# Patient Record
Sex: Female | Born: 1960 | Race: Black or African American | Hispanic: No | Marital: Single | Smoking: Current every day smoker
Health system: Southern US, Community
[De-identification: ages and names within clinical notes are randomized; demographics above are authoritative.]

## PROBLEM LIST (undated history)

## (undated) DIAGNOSIS — E785 Hyperlipidemia, unspecified: Secondary | ICD-10-CM

## (undated) DIAGNOSIS — G629 Polyneuropathy, unspecified: Secondary | ICD-10-CM

## (undated) DIAGNOSIS — E119 Type 2 diabetes mellitus without complications: Secondary | ICD-10-CM

## (undated) DIAGNOSIS — I10 Essential (primary) hypertension: Secondary | ICD-10-CM

## (undated) DIAGNOSIS — I639 Cerebral infarction, unspecified: Secondary | ICD-10-CM

---

## 2020-11-05 ENCOUNTER — Other Ambulatory Visit: Payer: Self-pay

## 2020-11-05 ENCOUNTER — Emergency Department: Payer: Medicare Other

## 2020-11-05 ENCOUNTER — Inpatient Hospital Stay
Admission: EM | Admit: 2020-11-05 | Discharge: 2020-11-15 | DRG: 871 | Disposition: A | Discharge status: Home Health | Payer: Medicare Other | Attending: Internal Medicine | Admitting: Internal Medicine

## 2020-11-05 ENCOUNTER — Encounter: Payer: Self-pay | Admitting: Emergency Medicine

## 2020-11-05 DIAGNOSIS — E876 Hypokalemia: Secondary | ICD-10-CM | POA: Diagnosis not present

## 2020-11-05 DIAGNOSIS — J189 Pneumonia, unspecified organism: Secondary | ICD-10-CM | POA: Diagnosis present

## 2020-11-05 DIAGNOSIS — E1122 Type 2 diabetes mellitus with diabetic chronic kidney disease: Secondary | ICD-10-CM | POA: Diagnosis present

## 2020-11-05 DIAGNOSIS — N139 Obstructive and reflux uropathy, unspecified: Secondary | ICD-10-CM | POA: Diagnosis present

## 2020-11-05 DIAGNOSIS — G4733 Obstructive sleep apnea (adult) (pediatric): Secondary | ICD-10-CM | POA: Diagnosis present

## 2020-11-05 DIAGNOSIS — F419 Anxiety disorder, unspecified: Secondary | ICD-10-CM | POA: Diagnosis present

## 2020-11-05 DIAGNOSIS — N189 Chronic kidney disease, unspecified: Secondary | ICD-10-CM | POA: Diagnosis present

## 2020-11-05 DIAGNOSIS — I129 Hypertensive chronic kidney disease with stage 1 through stage 4 chronic kidney disease, or unspecified chronic kidney disease: Secondary | ICD-10-CM | POA: Diagnosis present

## 2020-11-05 DIAGNOSIS — R338 Other retention of urine: Secondary | ICD-10-CM | POA: Diagnosis not present

## 2020-11-05 DIAGNOSIS — Z7902 Long term (current) use of antithrombotics/antiplatelets: Secondary | ICD-10-CM

## 2020-11-05 DIAGNOSIS — E785 Hyperlipidemia, unspecified: Secondary | ICD-10-CM | POA: Diagnosis present

## 2020-11-05 DIAGNOSIS — Z8673 Personal history of transient ischemic attack (TIA), and cerebral infarction without residual deficits: Secondary | ICD-10-CM

## 2020-11-05 DIAGNOSIS — E1142 Type 2 diabetes mellitus with diabetic polyneuropathy: Secondary | ICD-10-CM | POA: Diagnosis not present

## 2020-11-05 DIAGNOSIS — L0231 Cutaneous abscess of buttock: Secondary | ICD-10-CM | POA: Diagnosis not present

## 2020-11-05 DIAGNOSIS — M109 Gout, unspecified: Secondary | ICD-10-CM | POA: Diagnosis present

## 2020-11-05 DIAGNOSIS — Z6841 Body Mass Index (BMI) 40.0 and over, adult: Secondary | ICD-10-CM | POA: Diagnosis not present

## 2020-11-05 DIAGNOSIS — I10 Essential (primary) hypertension: Secondary | ICD-10-CM | POA: Diagnosis present

## 2020-11-05 DIAGNOSIS — E039 Hypothyroidism, unspecified: Secondary | ICD-10-CM | POA: Diagnosis present

## 2020-11-05 DIAGNOSIS — B379 Candidiasis, unspecified: Secondary | ICD-10-CM | POA: Diagnosis not present

## 2020-11-05 DIAGNOSIS — R6520 Severe sepsis without septic shock: Secondary | ICD-10-CM | POA: Diagnosis present

## 2020-11-05 DIAGNOSIS — N179 Acute kidney failure, unspecified: Secondary | ICD-10-CM | POA: Diagnosis present

## 2020-11-05 DIAGNOSIS — E11649 Type 2 diabetes mellitus with hypoglycemia without coma: Secondary | ICD-10-CM | POA: Diagnosis not present

## 2020-11-05 DIAGNOSIS — F32A Depression, unspecified: Secondary | ICD-10-CM | POA: Diagnosis present

## 2020-11-05 DIAGNOSIS — F17210 Nicotine dependence, cigarettes, uncomplicated: Secondary | ICD-10-CM | POA: Diagnosis present

## 2020-11-05 DIAGNOSIS — E1140 Type 2 diabetes mellitus with diabetic neuropathy, unspecified: Secondary | ICD-10-CM | POA: Diagnosis present

## 2020-11-05 DIAGNOSIS — A4181 Sepsis due to Enterococcus: Secondary | ICD-10-CM | POA: Diagnosis present

## 2020-11-05 DIAGNOSIS — R339 Retention of urine, unspecified: Secondary | ICD-10-CM | POA: Diagnosis not present

## 2020-11-05 DIAGNOSIS — G9341 Metabolic encephalopathy: Secondary | ICD-10-CM | POA: Diagnosis present

## 2020-11-05 DIAGNOSIS — R404 Transient alteration of awareness: Secondary | ICD-10-CM | POA: Diagnosis not present

## 2020-11-05 DIAGNOSIS — E119 Type 2 diabetes mellitus without complications: Secondary | ICD-10-CM

## 2020-11-05 DIAGNOSIS — L03317 Cellulitis of buttock: Secondary | ICD-10-CM | POA: Diagnosis not present

## 2020-11-05 DIAGNOSIS — Z79890 Hormone replacement therapy (postmenopausal): Secondary | ICD-10-CM

## 2020-11-05 DIAGNOSIS — K611 Rectal abscess: Secondary | ICD-10-CM | POA: Diagnosis not present

## 2020-11-05 DIAGNOSIS — K7460 Unspecified cirrhosis of liver: Secondary | ICD-10-CM | POA: Diagnosis present

## 2020-11-05 DIAGNOSIS — R7401 Elevation of levels of liver transaminase levels: Secondary | ICD-10-CM

## 2020-11-05 DIAGNOSIS — Z7982 Long term (current) use of aspirin: Secondary | ICD-10-CM

## 2020-11-05 DIAGNOSIS — R531 Weakness: Secondary | ICD-10-CM | POA: Diagnosis present

## 2020-11-05 DIAGNOSIS — Z20822 Contact with and (suspected) exposure to covid-19: Secondary | ICD-10-CM | POA: Diagnosis present

## 2020-11-05 HISTORY — DX: Essential (primary) hypertension: I10

## 2020-11-05 HISTORY — DX: Hyperlipidemia, unspecified: E78.5

## 2020-11-05 HISTORY — DX: Cerebral infarction, unspecified: I63.9

## 2020-11-05 HISTORY — DX: Polyneuropathy, unspecified: G62.9

## 2020-11-05 HISTORY — DX: Type 2 diabetes mellitus without complications: E11.9

## 2020-11-05 LAB — BASIC METABOLIC PANEL
Anion gap: 12 (ref 5–15)
BUN: 44 mg/dL — ABNORMAL HIGH (ref 6–20)
CO2: 20 mmol/L — ABNORMAL LOW (ref 22–32)
Calcium: 9 mg/dL (ref 8.9–10.3)
Chloride: 102 mmol/L (ref 98–111)
Creatinine, Ser: 2.96 mg/dL — ABNORMAL HIGH (ref 0.44–1.00)
GFR, Estimated: 18 mL/min — ABNORMAL LOW (ref >60)
Glucose, Bld: 151 mg/dL — ABNORMAL HIGH (ref 70–99)
Potassium: 3.8 mmol/L (ref 3.5–5.1)
Sodium: 134 mmol/L — ABNORMAL LOW (ref 135–145)

## 2020-11-05 LAB — URINALYSIS, COMPLETE (UACMP) WITH MICROSCOPIC
Bilirubin Urine: NEGATIVE
Glucose, UA: >=500 mg/dL — AB
Ketones, ur: NEGATIVE mg/dL
Leukocytes,Ua: NEGATIVE
Nitrite: NEGATIVE
Protein, ur: >=300 mg/dL — AB
Specific Gravity, Urine: 1.017 (ref 1.005–1.030)
pH: 5 (ref 5.0–8.0)

## 2020-11-05 LAB — CBC
HCT: 35.4 % — ABNORMAL LOW (ref 36.0–46.0)
Hemoglobin: 11.5 g/dL — ABNORMAL LOW (ref 12.0–15.0)
MCH: 26.6 pg (ref 26.0–34.0)
MCHC: 32.5 g/dL (ref 30.0–36.0)
MCV: 81.8 fL (ref 80.0–100.0)
Platelets: 196 10*3/uL (ref 150–400)
RBC: 4.33 MIL/uL (ref 3.87–5.11)
RDW: 18.3 % — ABNORMAL HIGH (ref 11.5–15.5)
WBC: 17.8 10*3/uL — ABNORMAL HIGH (ref 4.0–10.5)
nRBC: 0 % (ref 0.0–0.2)

## 2020-11-05 LAB — RESP PANEL BY RT-PCR (FLU A&B, COVID) ARPGX2
Influenza A by PCR: NEGATIVE
Influenza B by PCR: NEGATIVE
SARS Coronavirus 2 by RT PCR: NEGATIVE

## 2020-11-05 LAB — CBG MONITORING, ED: Glucose-Capillary: 122 mg/dL — ABNORMAL HIGH (ref 70–99)

## 2020-11-05 MED ORDER — SODIUM CHLORIDE 0.9 % IV BOLUS
1000.0000 mL | Freq: Once | INTRAVENOUS | Status: AC
  Administered 2020-11-05: 1000 mL via INTRAVENOUS

## 2020-11-05 MED ORDER — INSULIN ASPART 100 UNIT/ML ~~LOC~~ SOLN
0.0000 [IU] | Freq: Every day | SUBCUTANEOUS | Status: DC
  Administered 2020-11-12: 22:00:00 2 [IU] via SUBCUTANEOUS
  Administered 2020-11-13: 22:00:00 5 [IU] via SUBCUTANEOUS
  Administered 2020-11-14: 22:00:00 3 [IU] via SUBCUTANEOUS
  Filled 2020-11-05 (×3): qty 1

## 2020-11-05 MED ORDER — INSULIN ASPART 100 UNIT/ML ~~LOC~~ SOLN
0.0000 [IU] | Freq: Three times a day (TID) | SUBCUTANEOUS | Status: DC
  Administered 2020-11-06 (×2): 11 [IU] via SUBCUTANEOUS
  Administered 2020-11-06 – 2020-11-08 (×2): 8 [IU] via SUBCUTANEOUS
  Administered 2020-11-10 – 2020-11-12 (×4): 3 [IU] via SUBCUTANEOUS
  Administered 2020-11-13: 2 [IU] via SUBCUTANEOUS
  Administered 2020-11-14: 8 [IU] via SUBCUTANEOUS
  Administered 2020-11-14: 11 [IU] via SUBCUTANEOUS
  Administered 2020-11-14: 16:00:00 5 [IU] via SUBCUTANEOUS
  Administered 2020-11-15 (×2): 8 [IU] via SUBCUTANEOUS
  Filled 2020-11-05 (×14): qty 1

## 2020-11-05 MED ORDER — AZITHROMYCIN 250 MG PO TABS: ORAL_TABLET | ORAL | 0 refills | Status: DC

## 2020-11-05 MED ORDER — SODIUM CHLORIDE 0.9 % IV SOLN: INTRAVENOUS | Status: DC

## 2020-11-05 MED ORDER — SODIUM CHLORIDE 0.9 % IV SOLN
500.0000 mg | INTRAVENOUS | Status: DC
  Administered 2020-11-05 – 2020-11-06 (×2): 500 mg via INTRAVENOUS
  Filled 2020-11-05 (×3): qty 500

## 2020-11-05 MED ORDER — SODIUM CHLORIDE 0.9 % IV SOLN
1.0000 g | Freq: Once | INTRAVENOUS | Status: AC
  Administered 2020-11-05: 1 g via INTRAVENOUS
  Filled 2020-11-05: qty 10

## 2020-11-05 MED ORDER — SODIUM CHLORIDE 0.9 % IV SOLN
2.0000 g | INTRAVENOUS | Status: DC
  Administered 2020-11-06 – 2020-11-09 (×4): 2 g via INTRAVENOUS
  Filled 2020-11-05 (×2): qty 20
  Filled 2020-11-05 (×3): qty 2

## 2020-11-05 MED ORDER — ENOXAPARIN SODIUM 40 MG/0.4ML ~~LOC~~ SOLN
40.0000 mg | SUBCUTANEOUS | Status: DC
  Administered 2020-11-06 – 2020-11-09 (×4): 40 mg via SUBCUTANEOUS
  Filled 2020-11-05 (×4): qty 0.4

## 2020-11-05 MED ORDER — ACETAMINOPHEN 325 MG PO TABS
650.0000 mg | ORAL_TABLET | Freq: Once | ORAL | Status: AC
  Administered 2020-11-05: 650 mg via ORAL
  Filled 2020-11-05: qty 2

## 2020-11-05 NOTE — ED Provider Notes (Signed)
[facility] Emergency Department Provider Note  ____________________________________________  Time seen: Approximately 4:14 PM  I have reviewed the triage vital signs and the nursing notes.   HISTORY  Chief Complaint Weakness    HPI Tina Gregory is a 59 y.o. female who presents the emergency department complaining of weakness, fall today.  Patient reports that she was going to [employer] with her sister when her sister letter out of the car.  Patient states that she was supposed to write a wheelchair but she became weak and fell hitting her head and landing on her left knee.  Patient initially complained of headache and knee pain.  With the interview in the room, patient endorsed having some weakness, some lower pelvic/abdominal pain earlier today.  Patient denied any pain other than a headache and left knee pain currently.  No visual changes are reported.  No neck pain.  Patient denies any shortness of breath but has had a light cough.  Patient denies any difficulty breathing.  No nausea, vomiting, diarrhea or constipation.  She does have history of diabetes, hypertension, neuropathy and CVA.  She also states that she has an undetermined heart condition but is unsure exactly what that entails.  She denied any other complaints or medical problems at this time.  No medicines after her fall today.  Patient states that "I could have had a fever, but I do not know.         Past Medical History:  Diagnosis Date  . Diabetes mellitus without complication (HCC)   . Hyperlipidemia   . Hypertension   . Neuropathy   . Stroke [facility])     Patient Active Problem List   Diagnosis Date Noted  . AKI (acute kidney injury) (HCC) 11/05/2020  . Diabetes (HCC) 11/05/2020  . Essential hypertension 11/05/2020  . Hyperlipemia 11/05/2020  . CAP (community acquired pneumonia) 11/05/2020      Prior to Admission medications   Medication Sig Start Date End Date Taking? Authorizing  Provider  azithromycin (ZITHROMAX Z-PAK) 250 MG tablet Take 2 tablets (500 mg) on  Day 1,  followed by 1 tablet (250 mg) once daily on Days 2 through 5. 11/05/20   Indra Wolters, Delorise Royals, PA-C    Allergies Lisinopril and Shellfish allergy  No family history on file.  Social History Social History   Tobacco Use  . Smoking status: Current Every Day Smoker    Packs/day: 0.50    Types: Cigarettes  . Smokeless tobacco: Never Used  Vaping Use  . Vaping Use: Never used  Substance Use Topics  . Alcohol use: Not on file  . Drug use: Not on file     Review of Systems  Constitutional: Unsure fever/chills.  Endorses weakness. Eyes: No visual changes. No discharge ENT: No upper respiratory complaints. Cardiovascular: no chest pain. Respiratory: no cough. No SOB. Gastrointestinal: Episode of lower abdominal pain this morning.  None currently..  No nausea, no vomiting.  No diarrhea.  No constipation. Genitourinary: Negative for dysuria. No hematuria Musculoskeletal: Left knee pain following a fall Skin: Negative for rash, abrasions, lacerations, ecchymosis. Neurological: Positive for headache following a fall.  Denies focal weakness or numbness.  10 System ROS otherwise negative.  ____________________________________________   PHYSICAL EXAM:  VITAL SIGNS: ED Triage Vitals  Enc Vitals Group     BP 11/05/20 1425 131/60     Pulse Rate 11/05/20 1425 100     Resp 11/05/20 1425 18     Temp 11/05/20 1425 99 F (37.2  C)     Temp Source 11/05/20 1425 Oral     SpO2 11/05/20 1425 94 %     Weight 11/05/20 1423 241 lb (109.3 kg)     Height 11/05/20 1423  (1.651 m)     Head Circumference --      Peak Flow --      Pain Score 11/05/20 1424 0     Pain Loc --      Pain Edu? --      Excl. in GC? --      Constitutional: Patient is somewhat somnolent but does awaken with shaking.  Patient will provide history and remain awake while you are actively talking with the patient.  Positive  for examination her looking at medical records results and the patient rapidly falling back to sleep.  When patient is awake she is alert and oriented. Well appearing and in no acute distress. Eyes: Conjunctivae are normal. PERRL. EOMI. Head: No visible signs of trauma.  No abrasions, lacerations, ecchymosis or hematoma.  Patient is nontender to palpation of the osseous structures of the skull and face.  No palpable abnormality or crepitus.  No battle signs, raccoon eyes, serosanguineous fluid drainage from the ears or nares. ENT:      Ears:       Nose: No congestion/rhinnorhea.      Mouth/Throat: Mucous membranes are moist.  Neck: No stridor.  No cervical spine tenderness to palpation. Hematological/Lymphatic/Immunilogical: No cervical lymphadenopathy. Cardiovascular: Normal rate, regular rhythm. Normal S1 and S2.  Good peripheral circulation. Respiratory: Normal respiratory effort without tachypnea or retractions. Lungs CTAB. Good air entry to the bases with no decreased or absent breath sounds. Musculoskeletal: Full range of motion to all extremities. No gross deformities appreciated.  Visualization of the left knee reveals no visible signs of deformity.  Patient is able to flex and extend the knee appropriately Neurologic:  Normal speech and language. No gross focal neurologic deficits are appreciated.  Skin:  Skin is warm, dry and intact. No rash noted. Psychiatric: Mood and affect are normal. Speech and behavior are normal. Patient exhibits appropriate insight and judgement.   ____________________________________________   LABS (all labs ordered are listed, but only abnormal results are displayed)  Labs Reviewed  BASIC METABOLIC PANEL - Abnormal; Notable for the following components:      Result Value   Sodium 134 (*)    CO2 20 (*)    Glucose, Bld 151 (*)    BUN 44 (*)    Creatinine, Ser 2.96 (*)    GFR, Estimated 18 (*)    All other components within normal limits  CBC -  Abnormal; Notable for the following components:   WBC 17.8 (*)    Hemoglobin 11.5 (*)    HCT 35.4 (*)    RDW 18.3 (*)    All other components within normal limits  URINALYSIS, COMPLETE (UACMP) WITH MICROSCOPIC - Abnormal; Notable for the following components:   Color, Urine YELLOW (*)    APPearance HAZY (*)    Glucose, UA >=500 (*)    Hgb urine dipstick SMALL (*)    Protein, ur >=300 (*)    Bacteria, UA RARE (*)    All other components within normal limits  CBG MONITORING, ED - Abnormal; Notable for the following components:   Glucose-Capillary 122 (*)    All other components within normal limits  RESP PANEL BY RT-PCR (FLU A&B, COVID) [id number]   ____________________________________________  EKG   ____________________________________________  RADIOLOGY I  personally viewed and evaluated these images as part of my medical decision making, as well as reviewing the written report by the radiologist.  ED Provider Interpretation: I concur with radiologist finding of no acute intracranial abnormality.  No skull fracture.  Visualization of left knee x-ray reveals no acute traumatic findings.  Edema soft tissue with no marked joint effusion.  Visualization of the chest x-ray revealed streaky opacities in the right lower lobe.  DG Chest 2 View  Result Date: 11/05/2020 CLINICAL DATA:  Weakness, cough, fall EXAM: CHEST - 2 VIEW COMPARISON:  None. FINDINGS: Heart size appears mildly enlarged. Atherosclerotic calcification of the aortic knob. Streaky right basilar opacities. No pleural effusion. No pneumothorax. IMPRESSION: 1. Streaky right basilar opacities, atelectasis versus infiltrate. 2. Mild cardiomegaly. Electronically Signed   By: Duanne Guess D.O.   On: 11/05/2020 17:52   CT HEAD WO CONTRAST  Result Date: 11/05/2020 CLINICAL DATA:  Head trauma, coagulopathy EXAM: CT HEAD WITHOUT CONTRAST TECHNIQUE: Contiguous axial images were obtained from the base of the skull through the vertex  without intravenous contrast. COMPARISON:  None. FINDINGS: Brain: Scattered moderate white matter microvascular ischemic changes throughout both cerebral hemispheres. No acute intracranial hemorrhage, new infarction, mass lesion, midline shift, herniation, hydrocephalus, or extra-axial fluid collection. No focal mass effect or edema. Cisterns are patent. No cerebellar abnormality. Vascular: No hyperdense vessel or unexpected calcification. Skull: Normal. Negative for fracture or focal lesion. Sinuses/Orbits: No acute finding. Other: None. IMPRESSION: Chronic white matter microvascular ischemic changes. No acute intracranial abnormality by noncontrast CT. Electronically Signed   By: Judie Petit.  Shick M.D.   On: 11/05/2020 15:00   DG Knee Complete 4 Views Left  Result Date: 11/05/2020 CLINICAL DATA:  Left knee pain, EXAM: LEFT KNEE - COMPLETE 4+ VIEW COMPARISON:  None. FINDINGS: No evidence of fracture, dislocation, or joint effusion. No evidence of arthropathy or other focal bone abnormality. There is diffuse soft tissue swelling. Mild medial compartment osteoarthritis is noted. IMPRESSION: No acute osseous abnormality. Diffuse soft tissue swelling. Electronically Signed   By: Jonna Clark M.D.   On: 11/05/2020 17:54    ____________________________________________    PROCEDURES  Procedure(s) performed:    Procedures    Medications  acetaminophen (TYLENOL) tablet 650 mg (650 mg Oral Given 11/05/20 1745)  cefTRIAXone (ROCEPHIN) 1 g in sodium chloride 0.9 % 100 mL IVPB (0 g Intravenous Stopped 11/05/20 2112)  sodium chloride 0.9 % bolus 1,000 mL (0 mLs Intravenous Stopped 11/05/20 2112)     ____________________________________________   INITIAL IMPRESSION / ASSESSMENT AND PLAN / ED COURSE  Pertinent labs & imaging results that were available during my care of the patient were reviewed by me and considered in my medical decision making (see chart for details).  Review of the [place] CSRS was  performed in accordance of the [facility] prior to dispensing any controlled drugs.  Clinical Course as of Nov 05 2134  Sat Nov 05, 2020  2033 Patient presented to emergency department complaining of a fall, striking her head and injuring her left knee.  Patient also complained of weakness for the past 3 to 5 days with associated cough and subjective fever.  No traumatic findings on imaging or exam.  Patient did have elevated white blood cells, streaky opacity consistent with community-acquired pneumonia and what appears to be an AKI.  No history of chronic kidney disease.  No previous labs for comparison.  Given the findings of infection with associated AKI I recommended admission.  Hospitalist was consulted and agreed  to admit but then patient declined admission.  We attempted to contact the patient's daughter to see if she could convince the patient to stay but patient is adamant that she does not want to remain in the hospital.  I have clearly discussed my concerns with patient's AKI with associated infection.  Patient is advised that without treatment, she could progress in regards to her acute kidney injury and this could become prominent even leading to dialysis.  Patient is aware of my concerns and still states that she would like to be discharged.  Patient is aware that this would be AGAINST MEDICAL ADVICE.  Patient states that she will sign paperwork stating that she is leaving AMA.  Patient has had Rocephin, fluids here in the emergency department.  I will prescribe oral antibiotics for her pneumonia at home even though she is leaving AMA.   [JC]    Clinical Course User Index [JC] Emilyn Ruble, Delorise Royals, PA-C         Patient's diagnosis is consistent with weakness, fall, community-acquired pneumonia.  Patient presented to the emergency department complaining of possible subjective fever, mild cough, weakness for 3 to 4 days.  Patient states that today she was going to [place] with her sister, had  gotten out of the car and sustained a fall.  She states that she was bending over to pick up her purse before sitting in a wheelchair and fell striking her head.  No loss of consciousness.  Patient was neurologically intact.  Overall, exam was reassuring.  No acute traumatic findings about the head.  Mild edema about the left knee without abrasions or lacerations.  Patient did have an elevated white blood cell count of 17.8, as well as reported weakness and subjective fever.  Chest x-ray with streaky opacities.  Given the patient's cough, weakness, subjective fever I feel this is likely source of patient's weakness as well as patient has a elevated white blood cell count.  ..  I will treat patient's pneumonia with Rocephin here in the ED.  Patient did have a elevated creatinine of 2.96.  No previous labs on record for comparison. Patient with GFR of 18.  Patient is a poor historian in regards to kidney function in the past but states she has never been told she has kidney issues.  No associated hyperkalemia.  Patient does not appear to be fluid overloaded.  At this time, with community-acquired pneumonia, creatinine of 2.96 (which I suspect is an AKI with patient reported history of no chronic kidney disease), I feel that patient would be best suited with admission.  Addendum: Patient's daughter eventually arrived in the emergency department and after a conversation with the patient and convinced her to stay to be admitted.  Patient had been placed for discharge with AMA status but this has been reversed at this time.  I had previously discussed the patient with the hospitalist service for admission and they had agreed to admit.  I have informed him that the patient is no longer leaving AGAINST MEDICAL ADVICE and the advised that they will admit the patient to the hospitalist service.  Patient care will be transferred to the hospital service at this time. ____________________________________________  FINAL  CLINICAL IMPRESSION(S) / ED DIAGNOSES  Final diagnoses:  Community acquired pneumonia of right lower lobe of lung  AKI (acute kidney injury) (HCC)      NEW MEDICATIONS STARTED DURING THIS VISIT:  ED Discharge Orders         Ordered  azithromycin (ZITHROMAX Z-PAK) 250 MG tablet        11/05/20 2047              This chart was dictated using voice recognition software/Dragon. Despite best efforts to proofread, errors can occur which can change the meaning. Any change was purely unintentional.    Racheal PatchesCuthriell, Navdeep Fessenden D, PA-C 11/05/20 2135    Phineas SemenGoodman, [place], MD 11/06/20 1505

## 2020-11-05 NOTE — ED Triage Notes (Signed)
Pt arrived via POV with reports of weakness x 3-4 days as well as L leg pain.  Denies any N/V/D.  Pt alert and oriented at this time, speech clear.  Pt reports she dropped her purse and bent down to pick it up and fell over hitting her head on concrete. Pt denies any LOC.  Pt takes plavix and baby aspirin daily.

## 2020-11-05 NOTE — ED Notes (Signed)
Patients phone was found in room when cleaning. Phone taken to nurses station on 1C by this RN at this time as tube station is not working at this time.

## 2020-11-05 NOTE — ED Notes (Signed)
Patient states she has opted to stay for admission rather than leave AMA. Provider made aware.

## 2020-11-05 NOTE — H&P (Signed)
History and Physical   Tina Gregory ZJI:[id number] DOB: [birth year] DOA: 11/05/2020  Referring MD/NP/PA: Dr. Lenard Lance  PCP: Pcp, No   Outpatient Specialists: None  Patient coming from: Home  Chief Complaint: Fall and weakness  HPI: Tina Gregory is a 59 y.o. female with medical history significant of diabetes, hypertension, diabetic neuropathy, history of CVA, hyperlipidemia among other things who was brought in by family secondary to weakness and fall today.  Patient report that she was going to [employer] to apparently see her sister when she went out of the car and became weak.  Patient fell down.  She was supposed to get on a wheelchair but she landed on her left knee.  She was having pain all over.  She came to the ER where she was evaluated.  She was found to have acute kidney injury but also what appears to be pneumonia.  She was a poor historian and poor compliant.  Patient refusing to stay in the hospital initially but family later convinced her.  She had no confirmed fever or chills but only weakness.  At some point she felt she may have had subjective fever but cannot remember.  At this point patient is being admitted was pneumonia as well as acute kidney injury.  ED Course: Temperature 99.5 blood pressure 150/1 7 7  pulse 112 respiratory 22 oxygen sat 92% on room air.  Respiratory screen is negative urinalysis negative.  Sodium 134 potassium 3.8 glucose 151 CO2 20 BUN 44 creatinine 2.96.  White count 17.8 hemoglobin 11.5 platelets 196.  Chest x-ray showed right basilar opacities possibly infiltrate and mild cardiomegaly.  X-ray of the left knee showed no acute findings.  Head CT without contrast showed no acute findings.  Patient being admitted to the hospital to treat for her AKI on possible chronic kidney disease as well as pneumonia  Review of Systems: As per HPI otherwise 10 point review of systems negative.    Past Medical History:  Diagnosis Date  . Diabetes mellitus without  complication (HCC)   . Hyperlipidemia   . Hypertension   . Neuropathy   . Stroke [facility])     History reviewed. No pertinent surgical history.   reports that she has been smoking cigarettes. She has been smoking about 0.50 packs per day. She has never used smokeless tobacco. She reports that she does not drink alcohol and does not use drugs.  Allergies  Allergen Reactions  . Lisinopril     Headache, shaking  . Shellfish Allergy Hives    History reviewed. No pertinent family history.   Prior to Admission medications   Medication Sig Start Date End Date Taking? Authorizing Provider  azithromycin (ZITHROMAX Z-PAK) 250 MG tablet Take 2 tablets (500 mg) on  Day 1,  followed by 1 tablet (250 mg) once daily on Days 2 through 5. 11/05/20   Racheal Patches, PA-C    Physical Exam: Vitals:   11/05/20 2030 11/05/20 2100 11/05/20 2200 11/05/20 2257  BP: 127/77 136/82 (!) 154/77 (!) 152/82  Pulse: (!) 106 (!) 107 (!) 112 (!) 112  Resp: 18 18 18  (!) 22  Temp:    99.5 F (37.5 C)  TempSrc:    Oral  SpO2: 94% 92% 95% 95%  Weight:      Height:          Constitutional: Morbidly obese, acutely ill looking, no distress Vitals:   11/05/20 2030 11/05/20 2100 11/05/20 2200 11/05/20 2257  BP: 127/77 136/82 (!) 154/77 (!) 152/82  Pulse: (!) 106 (!) 107 (!) 112 (!) 112  Resp: 18 18 18  (!) 22  Temp:    99.5 F (37.5 C)  TempSrc:    Oral  SpO2: 94% 92% 95% 95%  Weight:      Height:       Eyes: PERRL, lids and conjunctivae normal ENMT: Mucous membranes are dry. Posterior pharynx clear of any exudate or lesions.Normal dentition.  Neck: normal, supple, no masses, no thyromegaly Respiratory: clear to auscultation bilaterally, no wheezing, basal crackles and rales normal respiratory effort. No accessory muscle use.  Cardiovascular: Sinus tachycardia, no murmurs / rubs / gallops. No extremity edema. 2+ pedal pulses. No carotid bruits.  Abdomen: no tenderness, no masses palpated. No  hepatosplenomegaly. Bowel sounds positive.  Musculoskeletal: no clubbing / cyanosis. No joint deformity upper and lower extremities. Good ROM, no contractures. Normal muscle tone.  Skin: no rashes, lesions, ulcers. No induration Neurologic: CN 2-12 grossly intact. Sensation intact, DTR normal. Strength 5/5 in all 4.  Psychiatric: Drowsy, communicating    Labs on Admission: I have personally reviewed following labs and imaging studies  CBC: Recent Labs  Lab 11/05/20 1402  WBC 17.8*  HGB 11.5*  HCT 35.4*  MCV 81.8  PLT 196   Basic Metabolic Panel: Recent Labs  Lab 11/05/20 1402  NA 134*  K 3.8  CL 102  CO2 20*  GLUCOSE 151*  BUN 44*  CREATININE 2.96*  CALCIUM 9.0   GFR: Estimated Creatinine Clearance: 25.2 mL/min (A) (by C-G formula based on SCr of 2.96 mg/dL (H)). Liver Function Tests: No results for input(s): AST, ALT, ALKPHOS, BILITOT, PROT, ALBUMIN in the last 168 hours. No results for input(s): LIPASE, AMYLASE in the last 168 hours. No results for input(s): AMMONIA in the last 168 hours. Coagulation Profile: No results for input(s): INR, PROTIME in the last 168 hours. Cardiac Enzymes: No results for input(s): CKTOTAL, CKMB, CKMBINDEX, TROPONINI in the last 168 hours. BNP (last 3 results) No results for input(s): PROBNP in the last 8760 hours. HbA1C: No results for input(s): HGBA1C in the last 72 hours. CBG: Recent Labs  Lab 11/05/20 1556  GLUCAP 122*   Lipid Profile: No results for input(s): CHOL, HDL, LDLCALC, TRIG, CHOLHDL, LDLDIRECT in the last 72 hours. Thyroid Function Tests: No results for input(s): TSH, T4TOTAL, FREET4, T3FREE, THYROIDAB in the last 72 hours. Anemia Panel: No results for input(s): VITAMINB12, FOLATE, FERRITIN, TIBC, IRON, RETICCTPCT in the last 72 hours. Urine analysis:    Component Value Date/Time   COLORURINE YELLOW (A) 11/05/2020 1727   APPEARANCEUR HAZY (A) 11/05/2020 1727   LABSPEC 1.017 11/05/2020 1727   PHURINE 5.0  11/05/2020 1727   GLUCOSEU >=500 (A) 11/05/2020 1727   HGBUR SMALL (A) 11/05/2020 1727   BILIRUBINUR NEGATIVE 11/05/2020 1727   KETONESUR NEGATIVE 11/05/2020 1727   PROTEINUR >=300 (A) 11/05/2020 1727   NITRITE NEGATIVE 11/05/2020 1727   LEUKOCYTESUR NEGATIVE 11/05/2020 1727   Sepsis Labs: @LABRCNTIP (procalcitonin:4,lacticidven:4) ) Recent Results (from the past 240 hour(s))  Resp Panel by RT-PCR (Flu A&B, Covid) Nasopharyngeal Swab     Status: None   Collection Time: 11/05/20  9:36 PM   Specimen: Nasopharyngeal Swab; Nasopharyngeal(NP) swabs in vial transport medium  Result Value Ref Range Status   SARS Coronavirus 2 by RT PCR NEGATIVE NEGATIVE Final    Comment: (NOTE) SARS-CoV-2 target nucleic acids are NOT DETECTED.  The SARS-CoV-2 RNA is generally detectable in upper respiratory specimens during the acute phase of infection. The lowest concentration of  SARS-CoV-2 viral copies this assay can detect is 138 copies/mL. A negative result does not preclude SARS-Cov-2 infection and should not be used as the sole basis for treatment or other patient management decisions. A negative result may occur with  improper specimen collection/handling, submission of specimen other than nasopharyngeal swab, presence of viral mutation(s) within the areas targeted by this assay, and inadequate number of viral copies(<138 copies/mL). A negative result must be combined with clinical observations, patient history, and epidemiological information. The expected result is Negative.  Fact Sheet for Patients:  BloggerCourse.comhttps://www.fda.gov/media/152166/download  Fact Sheet for Healthcare Providers:  SeriousBroker.ithttps://www.fda.gov/media/152162/download  This test is no t yet approved or cleared by the [place] FDA and  has been authorized for detection and/or diagnosis of SARS-CoV-2 by FDA under an Emergency Use Authorization (EUA). This EUA will remain  in effect (meaning this test can be used) for the duration of  the COVID-19 declaration under Section 564(b)(1) of the Act, 21 U.S.C.section 360bbb-3(b)(1), unless the authorization is terminated  or revoked sooner.       Influenza A by PCR NEGATIVE NEGATIVE Final   Influenza B by PCR NEGATIVE NEGATIVE Final    Comment: (NOTE) The Xpert Xpress SARS-CoV-2/FLU/RSV plus assay is intended as an aid in the diagnosis of influenza from Nasopharyngeal swab specimens and should not be used as a sole basis for treatment. Nasal washings and aspirates are unacceptable for Xpert Xpress SARS-CoV-2/FLU/RSV testing.  Fact Sheet for Patients: BloggerCourse.comhttps://www.fda.gov/media/152166/download  Fact Sheet for Healthcare Providers: SeriousBroker.ithttps://www.fda.gov/media/152162/download  This test is not yet approved or cleared by the [place] FDA and has been authorized for detection and/or diagnosis of SARS-CoV-2 by FDA under an Emergency Use Authorization (EUA). This EUA will remain in effect (meaning this test can be used) for the duration of the COVID-19 declaration under Section 564(b)(1) of the Act, 21 U.S.C. section 360bbb-3(b)(1), unless the authorization is terminated or revoked.  Performed at [facility], [address] [place], [place] [id number]      Radiological Exams on Admission: DG Chest 2 View  Result Date: 11/05/2020 CLINICAL DATA:  Weakness, cough, fall EXAM: CHEST - 2 VIEW COMPARISON:  None. FINDINGS: Heart size appears mildly enlarged. Atherosclerotic calcification of the aortic knob. Streaky right basilar opacities. No pleural effusion. No pneumothorax. IMPRESSION: 1. Streaky right basilar opacities, atelectasis versus infiltrate. 2. Mild cardiomegaly. Electronically Signed   By: Duanne GuessNicholas  Plundo D.O.   On: 11/05/2020 17:52   CT HEAD WO CONTRAST  Result Date: 11/05/2020 CLINICAL DATA:  Head trauma, coagulopathy EXAM: CT HEAD WITHOUT CONTRAST TECHNIQUE: Contiguous axial images were obtained from the base of the skull through the vertex  without intravenous contrast. COMPARISON:  None. FINDINGS: Brain: Scattered moderate white matter microvascular ischemic changes throughout both cerebral hemispheres. No acute intracranial hemorrhage, new infarction, mass lesion, midline shift, herniation, hydrocephalus, or extra-axial fluid collection. No focal mass effect or edema. Cisterns are patent. No cerebellar abnormality. Vascular: No hyperdense vessel or unexpected calcification. Skull: Normal. Negative for fracture or focal lesion. Sinuses/Orbits: No acute finding. Other: None. IMPRESSION: Chronic white matter microvascular ischemic changes. No acute intracranial abnormality by noncontrast CT. Electronically Signed   By: Judie PetitM.  Shick M.D.   On: 11/05/2020 15:00   DG Knee Complete 4 Views Left  Result Date: 11/05/2020 CLINICAL DATA:  Left knee pain, EXAM: LEFT KNEE - COMPLETE 4+ VIEW COMPARISON:  None. FINDINGS: No evidence of fracture, dislocation, or joint effusion. No evidence of arthropathy or other focal bone abnormality. There is diffuse soft  tissue swelling. Mild medial compartment osteoarthritis is noted. IMPRESSION: No acute osseous abnormality. Diffuse soft tissue swelling. Electronically Signed   By: Jonna Clark M.D.   On: 11/05/2020 17:54    EKG: Independently reviewed.  It shows sinus tachycardia with a rate of 105.  Nonspecific ST changes with possible fusion complexes.  Assessment/Plan Principal Problem:   CAP (community acquired pneumonia) Active Problems:   AKI (acute kidney injury) (HCC)   Diabetes (HCC)   Essential hypertension   Hyperlipemia     #1 community-acquired pneumonia: Patient will be admitted and initiated on Rocephin and Zithromax.  She is meets sepsis criteria based on her heart rate pulse rate and leukocytes.  Also evidence of infection.  Will treat patient as such.  #2 sepsis due to pneumonia: Patient meets sepsis criteria given due to tachycardia increased respiratory rate leukocytosis and evidence of  pneumonia.  She has no hypoxia or endorgan damage.  Continue treatment with fluids and antibiotics.  #3 AKI: Probably has chronic kidney disease.  Hydrate patient and monitor BUN and creatinine.  Renal ultrasound to rule out obstruction  #4 diabetes: Sliding scale insulin with close monitoring.  #5 essential hypertension: Avoid nephrotoxic medications but resume medicines.  #6 hyperlipidemia: Resume home regimen  #7 morbid obesity: Dietary counseling  #8 altered mental status: Patient appears drowsy but arousable and communicating.  Probably due to acute illness   DVT prophylaxis: Heparin Code Status: Full code Family Communication: No family at bedside but over the phone Disposition Plan: Home Consults called: None Admission status: Inpatient  Severity of Illness: The appropriate patient status for this patient is INPATIENT. Inpatient status is judged to be reasonable and necessary in order to provide the required intensity of service to ensure the patient's safety. The patient's presenting symptoms, physical exam findings, and initial radiographic and laboratory data in the context of their chronic comorbidities is felt to place them at high risk for further clinical deterioration. Furthermore, it is not anticipated that the patient will be medically stable for discharge from the hospital within 2 midnights of admission. The following factors support the patient status of inpatient.   " The patient's presenting symptoms include altered mental status and weakness. " The worrisome physical exam findings include confusion. " The initial radiographic and laboratory data are worrisome because of increased creatinine and BUN with pneumonia. " The chronic co-morbidities include diabetes hypertension morbid obesity.   * I certify that at the point of admission it is my clinical judgment that the patient will require inpatient hospital care spanning beyond 2 midnights from the point of  admission due to high intensity of service, high risk for further deterioration and high frequency of surveillance required.Lonia Blood MD [facility] Pager [phone]  If 7PM-7AM, please contact night-coverage [email or web address] Password [facility]  11/05/2020, 10:59 PM

## 2020-11-05 NOTE — ED Notes (Signed)
Pt at XRAY

## 2020-11-05 NOTE — ED Notes (Signed)
Pt presents to ED with a c/o of having a fall yesterday and today. Pt states today she bent over to pick up her purse and fell. Pt states when she fell she hurt her L knee and L lower leg, no deformity noted. Pt denies fevers or chills. Pt denies urinary symptoms. Pt denies N/V/D. Pt is A&Ox4.

## 2020-11-05 NOTE — ED Notes (Signed)
Patient informed of bed placement. Patient daughter en route back to hospital with food for patient that pt requested. Patient also requested [place] snack pack and water. Both provided to patient while awaiting daughters return.

## 2020-11-05 NOTE — ED Notes (Signed)
Report off to mackenzie rn  

## 2020-11-06 DIAGNOSIS — E1142 Type 2 diabetes mellitus with diabetic polyneuropathy: Secondary | ICD-10-CM | POA: Diagnosis not present

## 2020-11-06 DIAGNOSIS — N179 Acute kidney failure, unspecified: Secondary | ICD-10-CM | POA: Diagnosis not present

## 2020-11-06 DIAGNOSIS — I10 Essential (primary) hypertension: Secondary | ICD-10-CM | POA: Diagnosis not present

## 2020-11-06 DIAGNOSIS — J189 Pneumonia, unspecified organism: Secondary | ICD-10-CM | POA: Diagnosis not present

## 2020-11-06 LAB — CBC WITH DIFFERENTIAL/PLATELET
Abs Immature Granulocytes: 0.14 10*3/uL — ABNORMAL HIGH (ref 0.00–0.07)
Basophils Absolute: 0 10*3/uL (ref 0.0–0.1)
Basophils Relative: 0 %
Eosinophils Absolute: 0.1 10*3/uL (ref 0.0–0.5)
Eosinophils Relative: 1 %
HCT: 30.8 % — ABNORMAL LOW (ref 36.0–46.0)
Hemoglobin: 9.8 g/dL — ABNORMAL LOW (ref 12.0–15.0)
Immature Granulocytes: 1 %
Lymphocytes Relative: 16 %
Lymphs Abs: 2.6 10*3/uL (ref 0.7–4.0)
MCH: 26 pg (ref 26.0–34.0)
MCHC: 31.8 g/dL (ref 30.0–36.0)
MCV: 81.7 fL (ref 80.0–100.0)
Monocytes Absolute: 1.9 10*3/uL — ABNORMAL HIGH (ref 0.1–1.0)
Monocytes Relative: 11 %
Neutro Abs: 12 10*3/uL — ABNORMAL HIGH (ref 1.7–7.7)
Neutrophils Relative %: 71 %
Platelets: 174 10*3/uL (ref 150–400)
RBC: 3.77 MIL/uL — ABNORMAL LOW (ref 3.87–5.11)
RDW: 18.1 % — ABNORMAL HIGH (ref 11.5–15.5)
Smear Review: ADEQUATE
WBC: 16.8 10*3/uL — ABNORMAL HIGH (ref 4.0–10.5)
nRBC: 0.1 % (ref 0.0–0.2)

## 2020-11-06 LAB — GLUCOSE, CAPILLARY
Glucose-Capillary: 182 mg/dL — ABNORMAL HIGH (ref 70–99)
Glucose-Capillary: 187 mg/dL — ABNORMAL HIGH (ref 70–99)
Glucose-Capillary: 295 mg/dL — ABNORMAL HIGH (ref 70–99)
Glucose-Capillary: 332 mg/dL — ABNORMAL HIGH (ref 70–99)
Glucose-Capillary: 347 mg/dL — ABNORMAL HIGH (ref 70–99)
Glucose-Capillary: 349 mg/dL — ABNORMAL HIGH (ref 70–99)

## 2020-11-06 LAB — COMPREHENSIVE METABOLIC PANEL
ALT: 53 U/L — ABNORMAL HIGH (ref 0–44)
AST: 64 U/L — ABNORMAL HIGH (ref 15–41)
Albumin: 2.3 g/dL — ABNORMAL LOW (ref 3.5–5.0)
Alkaline Phosphatase: 132 U/L — ABNORMAL HIGH (ref 38–126)
Anion gap: 10 (ref 5–15)
BUN: 44 mg/dL — ABNORMAL HIGH (ref 6–20)
CO2: 18 mmol/L — ABNORMAL LOW (ref 22–32)
Calcium: 8.4 mg/dL — ABNORMAL LOW (ref 8.9–10.3)
Chloride: 107 mmol/L (ref 98–111)
Creatinine, Ser: 2.96 mg/dL — ABNORMAL HIGH (ref 0.44–1.00)
GFR, Estimated: 18 mL/min — ABNORMAL LOW (ref >60)
Glucose, Bld: 221 mg/dL — ABNORMAL HIGH (ref 70–99)
Potassium: 3.7 mmol/L (ref 3.5–5.1)
Sodium: 135 mmol/L (ref 135–145)
Total Bilirubin: 1.1 mg/dL (ref 0.3–1.2)
Total Protein: 7 g/dL (ref 6.5–8.1)

## 2020-11-06 LAB — HEMOGLOBIN A1C
Hgb A1c MFr Bld: 10.8 % — ABNORMAL HIGH (ref 4.8–5.6)
Mean Plasma Glucose: 263.26 mg/dL

## 2020-11-06 LAB — HIV ANTIBODY (ROUTINE TESTING W REFLEX): HIV Screen 4th Generation wRfx: NONREACTIVE

## 2020-11-06 LAB — STREP PNEUMONIAE URINARY ANTIGEN: Strep Pneumo Urinary Antigen: NEGATIVE

## 2020-11-06 MED ORDER — PREGABALIN 50 MG PO CAPS
100.0000 mg | ORAL_CAPSULE | Freq: Three times a day (TID) | ORAL | Status: DC
  Administered 2020-11-06 – 2020-11-07 (×4): 100 mg via ORAL
  Filled 2020-11-06 (×4): qty 2

## 2020-11-06 MED ORDER — INSULIN ASPART 100 UNIT/ML ~~LOC~~ SOLN
15.0000 [IU] | Freq: Three times a day (TID) | SUBCUTANEOUS | Status: DC
  Administered 2020-11-06 (×2): 15 [IU] via SUBCUTANEOUS
  Filled 2020-11-06 (×2): qty 1

## 2020-11-06 MED ORDER — CARVEDILOL 25 MG PO TABS
25.0000 mg | ORAL_TABLET | Freq: Two times a day (BID) | ORAL | Status: DC
  Administered 2020-11-06 – 2020-11-15 (×18): 25 mg via ORAL
  Filled 2020-11-06 (×18): qty 1

## 2020-11-06 MED ORDER — GUAIFENESIN-DM 100-10 MG/5ML PO SYRP: 5.0000 mL | ORAL_SOLUTION | ORAL | Status: DC | PRN

## 2020-11-06 MED ORDER — INSULIN DETEMIR 100 UNIT/ML ~~LOC~~ SOLN
35.0000 [IU] | Freq: Two times a day (BID) | SUBCUTANEOUS | Status: DC
  Administered 2020-11-06 – 2020-11-07 (×3): 35 [IU] via SUBCUTANEOUS
  Filled 2020-11-06 (×4): qty 0.35

## 2020-11-06 MED ORDER — CLONIDINE HCL 0.1 MG PO TABS
0.2000 mg | ORAL_TABLET | Freq: Two times a day (BID) | ORAL | Status: DC
  Administered 2020-11-06 – 2020-11-15 (×18): 0.2 mg via ORAL
  Filled 2020-11-06 (×18): qty 2

## 2020-11-06 MED ORDER — ALLOPURINOL 100 MG PO TABS
300.0000 mg | ORAL_TABLET | Freq: Every day | ORAL | Status: DC
  Administered 2020-11-06 – 2020-11-07 (×2): 300 mg via ORAL
  Filled 2020-11-06 (×2): qty 3

## 2020-11-06 MED ORDER — AMITRIPTYLINE HCL 25 MG PO TABS
50.0000 mg | ORAL_TABLET | Freq: Every day | ORAL | Status: DC
  Administered 2020-11-06 – 2020-11-08 (×2): 50 mg via ORAL
  Filled 2020-11-06 (×2): qty 2

## 2020-11-06 MED ORDER — ASPIRIN EC 81 MG PO TBEC
81.0000 mg | DELAYED_RELEASE_TABLET | Freq: Every day | ORAL | Status: DC
  Administered 2020-11-06 – 2020-11-15 (×10): 81 mg via ORAL
  Filled 2020-11-06 (×10): qty 1

## 2020-11-06 MED ORDER — LEVOTHYROXINE SODIUM 25 MCG PO TABS
25.0000 ug | ORAL_TABLET | Freq: Every day | ORAL | Status: DC
  Administered 2020-11-06 – 2020-11-15 (×8): 25 ug via ORAL
  Filled 2020-11-06 (×8): qty 1

## 2020-11-06 MED ORDER — CLOPIDOGREL BISULFATE 75 MG PO TABS
75.0000 mg | ORAL_TABLET | Freq: Every day | ORAL | Status: DC
  Administered 2020-11-06 – 2020-11-15 (×10): 75 mg via ORAL
  Filled 2020-11-06 (×10): qty 1

## 2020-11-06 MED ORDER — ROSUVASTATIN CALCIUM 20 MG PO TABS
20.0000 mg | ORAL_TABLET | Freq: Every day | ORAL | Status: DC
  Administered 2020-11-06: 21:00:00 20 mg via ORAL
  Filled 2020-11-06: qty 1

## 2020-11-06 NOTE — Hospital Course (Signed)
Tina Gregory is a 59 y.o. female with medical history significant of diabetes, hypertension, diabetic neuropathy, history of CVA, hyperlipidemia among other things who was brought in to the ED on 11/05/20 by family secondary to generalized weakness and a fall while trying to get in a wheelchair in [place] parking lot.      Evaluation in the ED revealed leukocytosis, low grade temp 99.5 F, tachycardia, tachypnea.  Creatinine 2.96.  Chest xray with bibasilar opacities concerning for pneumonia.  Left knee xray unremarkable, showed only soft tissue swelling.    Admitted for further management of community-acquired pneumonia and AKI.

## 2020-11-06 NOTE — Progress Notes (Signed)
PROGRESS NOTE    Tina Gregory   ZOX:[id number]  DOB: 1961/09/06  PCP: Pcp, No    DOA: 11/05/2020 LOS: 1   Brief Narrative   Tina Gregory is a 59 y.o. female with medical history significant of diabetes, hypertension, diabetic neuropathy, history of CVA, hyperlipidemia among other things who was brought in to the ED on 11/05/20 by family secondary to generalized weakness and a fall while trying to get in a wheelchair in [place] parking lot.      Evaluation in the ED revealed leukocytosis, low grade temp 99.5 F, tachycardia, tachypnea.  Creatinine 2.96.  Chest xray with bibasilar opacities concerning for pneumonia.  Left knee xray unremarkable, showed only soft tissue swelling.    Admitted for further management of community-acquired pneumonia and AKI.     Assessment & Plan   Principal Problem:   CAP (community acquired pneumonia) Active Problems:   AKI (acute kidney injury) ([place])   Diabetes ([place])   Essential hypertension   Hyperlipemia   Severe sepsis due to Community-acquired pneumonia - POA.  Met sepsis criteria with leukocytosis, tachycardia, tachypnea and evidence of PNA on imaging.  Organ dysfunction as evidenced by AKI.  Treated per sepsis protocol on admission. --Continue Rocephin and Zithromax --Antitussives PRN  AKI - POA with Cr 2.96.  Baseline unknown but likely has some CKD secondary to HTN and diabetes. --Continue IV fluids, reduce to 75 cc/hr & d/c later if eating and drinking well and vitals stable --Renally dose meds --Hold Lasix, losartan and other nephrotoxins  Likely OSA - pt snoring when asleep, and with body habitus she likely has sleep apnea.   --Continuous pulse ox overnight --Recommend outpatient sleep study  Type 2 diabetes with neuropathy - Home regimen is Levemir 35 units BID, Novolog 20-25 units TID WC and Trulicity. --Continue Lyrica --sliding scale Novolog --resume Levemir 35 units BID --scheduled Novolog 15 units TID WC for now, increase  as needed --Monitor CBG's, hypoglycemia protocol  Essential hypertension - Takes Coreg, clonidine, Lasix and losartan.  Hold losartan and Lasix due to AKI.  Continue Coreg & clonidine.  Hx of CVA - continue ASA and Plavix.  Stable without acute neurologic changes.  Depression / Anxiety - continue Elavil  Hypothyroidism - continue levothyroxine  ?Gout - continue allopurinol  Obesity: Body mass index is 40.1 kg/m.  Complicates overall care and prognosis.    DVT prophylaxis: enoxaparin (LOVENOX) injection 40 mg Start: 11/06/20 1000   Diet:  Diet Orders (From admission, onward)    Start     Ordered   11/05/20 2312  Diet heart healthy/carb modified Room service appropriate? Yes; Fluid consistency: Thin  Diet effective now       Question Answer Comment  Diet-HS Snack? Nothing   Room service appropriate? Yes   Fluid consistency: Thin      11/05/20 2311            Code Status: Full Code    Subjective 11/06/20    Patient sleeping but responds to voice when seen this AM.  Reports feeling okay, just tired.  Denies fever/chills or pain at this time.  Asks to be let to sleep.   Disposition Plan & Communication   Status is: Inpatient  Remains inpatient appropriate because:IV treatments appropriate due to intensity of illness or inability to take PO   Dispo: The patient is from: Home              Anticipated d/c is to: Home  Anticipated d/c date is: 2 days              Patient currently is not medically stable to d/c.   Family Communication: none at bedside will attempt to call this afternoon.   Consults, Procedures, Significant Events   Consultants:   None  Procedures:   None  Antimicrobials:  Anti-infectives (From admission, onward)   Start     Dose/Rate Route Frequency Ordered Stop   11/06/20 1000  cefTRIAXone (ROCEPHIN) 2 g in sodium chloride 0.9 % 100 mL IVPB        2 g 200 mL/hr over 30 Minutes Intravenous Every 24 hours 11/05/20 2311  11/11/20 0959   11/05/20 2330  azithromycin (ZITHROMAX) 500 mg in sodium chloride 0.9 % 250 mL IVPB        500 mg 250 mL/hr over 60 Minutes Intravenous Every 24 hours 11/05/20 2311 11/10/20 2329   11/05/20 1900  cefTRIAXone (ROCEPHIN) 1 g in sodium chloride 0.9 % 100 mL IVPB        1 g 200 mL/hr over 30 Minutes Intravenous  Once 11/05/20 1852 11/05/20 2112   11/05/20 0000  azithromycin (ZITHROMAX Z-PAK) 250 MG tablet           11/05/20 2047          Objective   Vitals:   11/05/20 2200 11/05/20 2257 11/05/20 2338 11/06/20 0415  BP: (!) 154/77 (!) 152/82  108/75  Pulse: (!) 112  (!) 107 96  Resp: $Remo'18  20 20  'galzQ$ Temp:  99.5 F (37.5 C)  (!) 97 F (36.1 C)  TempSrc:  Oral  Axillary  SpO2: 95% 95% 94% 90%  Weight:      Height:        Intake/Output Summary (Last 24 hours) at 11/06/2020 0941 Last data filed at 11/05/2020 2112 Gross per 24 hour  Intake 1100 ml  Output --  Net 1100 ml   Filed Weights   11/05/20 1423  Weight: 109.3 kg    Physical Exam:  General exam: sleeping but responds to voice, no acute distress, obese HEENT: atraumatic, hearing grossly normal  Respiratory system: decreased breath sounds, no wheezes, normal respiratory effort, snoring. Cardiovascular system: normal S1/S2, RRR, unable to see JVD due to body habitus, trace lower extremity edema.   Gastrointestinal system: soft, NT, ND Central nervous system: no gross focal neurologic deficits, normal speech  Labs   Data Reviewed: I have personally reviewed following labs and imaging studies  CBC: Recent Labs  Lab 11/05/20 1402 11/06/20 0046  WBC 17.8* 16.8*  NEUTROABS  --  12.0*  HGB 11.5* 9.8*  HCT 35.4* 30.8*  MCV 81.8 81.7  PLT 196 620   Basic Metabolic Panel: Recent Labs  Lab 11/05/20 1402 11/06/20 0046  NA 134* 135  K 3.8 3.7  CL 102 107  CO2 20* 18*  GLUCOSE 151* 221*  BUN 44* 44*  CREATININE 2.96* 2.96*  CALCIUM 9.0 8.4*   GFR: Estimated Creatinine Clearance: 25.2 mL/min (A)  (by C-G formula based on SCr of 2.96 mg/dL (H)). Liver Function Tests: Recent Labs  Lab 11/06/20 0046  AST 64*  ALT 53*  ALKPHOS 132*  BILITOT 1.1  PROT 7.0  ALBUMIN 2.3*   No results for input(s): LIPASE, AMYLASE in the last 168 hours. No results for input(s): AMMONIA in the last 168 hours. Coagulation Profile: No results for input(s): INR, PROTIME in the last 168 hours. Cardiac Enzymes: No results for input(s): CKTOTAL, CKMB, CKMBINDEX, TROPONINI in  the last 168 hours. BNP (last 3 results) No results for input(s): PROBNP in the last 8760 hours. HbA1C: Recent Labs    11/06/20 0046  HGBA1C 10.8*   CBG: Recent Labs  Lab 11/05/20 1556 11/06/20 0002 11/06/20 0747  GLUCAP 122* 182* 347*   Lipid Profile: No results for input(s): CHOL, HDL, LDLCALC, TRIG, CHOLHDL, LDLDIRECT in the last 72 hours. Thyroid Function Tests: No results for input(s): TSH, T4TOTAL, FREET4, T3FREE, THYROIDAB in the last 72 hours. Anemia Panel: No results for input(s): VITAMINB12, FOLATE, FERRITIN, TIBC, IRON, RETICCTPCT in the last 72 hours. Sepsis Labs: No results for input(s): PROCALCITON, LATICACIDVEN in the last 168 hours.  Recent Results (from the past 240 hour(s))  Resp Panel by RT-PCR (Flu A&B, Covid) Nasopharyngeal Swab     Status: None   Collection Time: 11/05/20  9:36 PM   Specimen: Nasopharyngeal Swab; Nasopharyngeal(NP) swabs in vial transport medium  Result Value Ref Range Status   SARS Coronavirus 2 by RT PCR NEGATIVE NEGATIVE Final    Comment: (NOTE) SARS-CoV-2 target nucleic acids are NOT DETECTED.  The SARS-CoV-2 RNA is generally detectable in upper respiratory specimens during the acute phase of infection. The lowest concentration of SARS-CoV-2 viral copies this assay can detect is 138 copies/mL. A negative result does not preclude SARS-Cov-2 infection and should not be used as the sole basis for treatment or other patient management decisions. A negative result may occur  with  improper specimen collection/handling, submission of specimen other than nasopharyngeal swab, presence of viral mutation(s) within the areas targeted by this assay, and inadequate number of viral copies(<138 copies/mL). A negative result must be combined with clinical observations, patient history, and epidemiological information. The expected result is Negative.  Fact Sheet for Patients:  EntrepreneurPulse.com.au  Fact Sheet for Healthcare Providers:  IncredibleEmployment.be  This test is no t yet approved or cleared by the [place] FDA and  has been authorized for detection and/or diagnosis of SARS-CoV-2 by FDA under an Emergency Use Authorization (EUA). This EUA will remain  in effect (meaning this test can be used) for the duration of the COVID-19 declaration under Section 564(b)(1) of the Act, 21 U.S.C.section 360bbb-3(b)(1), unless the authorization is terminated  or revoked sooner.       Influenza A by PCR NEGATIVE NEGATIVE Final   Influenza B by PCR NEGATIVE NEGATIVE Final    Comment: (NOTE) The Xpert Xpress SARS-CoV-2/FLU/RSV plus assay is intended as an aid in the diagnosis of influenza from Nasopharyngeal swab specimens and should not be used as a sole basis for treatment. Nasal washings and aspirates are unacceptable for Xpert Xpress SARS-CoV-2/FLU/RSV testing.  Fact Sheet for Patients: EntrepreneurPulse.com.au  Fact Sheet for Healthcare Providers: IncredibleEmployment.be  This test is not yet approved or cleared by the [place] FDA and has been authorized for detection and/or diagnosis of SARS-CoV-2 by FDA under an Emergency Use Authorization (EUA). This EUA will remain in effect (meaning this test can be used) for the duration of the COVID-19 declaration under Section 564(b)(1) of the Act, 21 U.S.C. section 360bbb-3(b)(1), unless the authorization is terminated  or revoked.  Performed at [facility], [address] [place], [place] [address]   Culture, blood (routine x 2) Call MD if unable to obtain prior to antibiotics being given     Status: None (Preliminary result)   Collection Time: 11/06/20 12:46 AM   Specimen: BLOOD  Result Value Ref Range Status   Specimen Description BLOOD LEFT ANTECUBITAL  Final  Special Requests   Final    BOTTLES DRAWN AEROBIC AND ANAEROBIC Blood Culture adequate volume   Culture   Final    NO GROWTH < 12 HOURS Performed at [facility], [address] [place], [place] [address]    Report Status PENDING  Incomplete  Culture, blood (routine x 2) Call MD if unable to obtain prior to antibiotics being given     Status: None (Preliminary result)   Collection Time: 11/06/20 12:46 AM   Specimen: BLOOD  Result Value Ref Range Status   Specimen Description BLOOD BLOOD LEFT FOREARM  Final   Special Requests   Final    BOTTLES DRAWN AEROBIC AND ANAEROBIC Blood Culture adequate volume   Culture   Final    NO GROWTH < 12 HOURS Performed at [facility], [address] [place],  [address]    Report Status PENDING  Incomplete      Imaging Studies   DG Chest 2 View  Result Date: 11/05/2020 CLINICAL DATA:  Weakness, cough, fall EXAM: CHEST - 2 VIEW COMPARISON:  None. FINDINGS: Heart size appears mildly enlarged. Atherosclerotic calcification of the aortic knob. Streaky right basilar opacities. No pleural effusion. No pneumothorax. IMPRESSION: 1. Streaky right basilar opacities, atelectasis versus infiltrate. 2. Mild cardiomegaly. Electronically Signed   By: Davina Poke D.O.   On: 11/05/2020 17:52   CT HEAD WO CONTRAST  Result Date: 11/05/2020 CLINICAL DATA:  Head trauma, coagulopathy EXAM: CT HEAD WITHOUT CONTRAST TECHNIQUE: Contiguous axial images were obtained from the base of the skull through the vertex without intravenous contrast. COMPARISON:  None. FINDINGS: Brain:  Scattered moderate white matter microvascular ischemic changes throughout both cerebral hemispheres. No acute intracranial hemorrhage, new infarction, mass lesion, midline shift, herniation, hydrocephalus, or extra-axial fluid collection. No focal mass effect or edema. Cisterns are patent. No cerebellar abnormality. Vascular: No hyperdense vessel or unexpected calcification. Skull: Normal. Negative for fracture or focal lesion. Sinuses/Orbits: No acute finding. Other: None. IMPRESSION: Chronic white matter microvascular ischemic changes. No acute intracranial abnormality by noncontrast CT. Electronically Signed   By: Jerilynn Mages.  Shick M.D.   On: 11/05/2020 15:00   DG Knee Complete 4 Views Left  Result Date: 11/05/2020 CLINICAL DATA:  Left knee pain, EXAM: LEFT KNEE - COMPLETE 4+ VIEW COMPARISON:  None. FINDINGS: No evidence of fracture, dislocation, or joint effusion. No evidence of arthropathy or other focal bone abnormality. There is diffuse soft tissue swelling. Mild medial compartment osteoarthritis is noted. IMPRESSION: No acute osseous abnormality. Diffuse soft tissue swelling. Electronically Signed   By: Prudencio Pair M.D.   On: 11/05/2020 17:54     Medications   Scheduled Meds: . enoxaparin (LOVENOX) injection  40 mg Subcutaneous Q24H  . insulin aspart  0-15 Units Subcutaneous TID WC  . insulin aspart  0-5 Units Subcutaneous QHS   Continuous Infusions: . sodium chloride 125 mL/hr at 11/05/20 2325  . azithromycin 500 mg (11/05/20 2326)  . cefTRIAXone (ROCEPHIN)  IV         LOS: 1 day    Time spent: 30 minutes with > 50% spent in coordination of care and direct patient contact.    Ezekiel Slocumb, DO [facility]  11/06/2020, 9:41 AM    If 7PM-7AM, please contact night-coverage. How to contact the [facility] Attending or Consulting provider [place] or covering provider during after hours [place], for this patient?    1. Check the care team in [facility] and look for a)  attending/consulting  [place] provider listed and b) the [facility] team listed 2. Log into [email or web address] and use [place]'s universal password to access. If you do not have the password, please contact the hospital operator. 3. Locate the [facility] provider you are looking for under [facility] and page to a number that you can be directly reached. 4. If you still have difficulty reaching the provider, please page the [facility] (Director on Call) for the Hospitalists listed on amion for assistance.

## 2020-11-07 ENCOUNTER — Inpatient Hospital Stay: Payer: Medicare Other

## 2020-11-07 DIAGNOSIS — E1142 Type 2 diabetes mellitus with diabetic polyneuropathy: Secondary | ICD-10-CM | POA: Diagnosis not present

## 2020-11-07 DIAGNOSIS — I10 Essential (primary) hypertension: Secondary | ICD-10-CM | POA: Diagnosis not present

## 2020-11-07 DIAGNOSIS — N179 Acute kidney failure, unspecified: Secondary | ICD-10-CM | POA: Diagnosis not present

## 2020-11-07 DIAGNOSIS — J189 Pneumonia, unspecified organism: Secondary | ICD-10-CM | POA: Diagnosis not present

## 2020-11-07 LAB — HEPATITIS C ANTIBODY: HCV Ab: NONREACTIVE

## 2020-11-07 LAB — CBC WITH DIFFERENTIAL/PLATELET
Abs Immature Granulocytes: 0.05 10*3/uL (ref 0.00–0.07)
Basophils Absolute: 0 10*3/uL (ref 0.0–0.1)
Basophils Relative: 0 %
Eosinophils Absolute: 0.1 10*3/uL (ref 0.0–0.5)
Eosinophils Relative: 1 %
HCT: 29.8 % — ABNORMAL LOW (ref 36.0–46.0)
Hemoglobin: 9.5 g/dL — ABNORMAL LOW (ref 12.0–15.0)
Immature Granulocytes: 0 %
Lymphocytes Relative: 22 %
Lymphs Abs: 2.6 10*3/uL (ref 0.7–4.0)
MCH: 26.1 pg (ref 26.0–34.0)
MCHC: 31.9 g/dL (ref 30.0–36.0)
MCV: 81.9 fL (ref 80.0–100.0)
Monocytes Absolute: 1.5 10*3/uL — ABNORMAL HIGH (ref 0.1–1.0)
Monocytes Relative: 12 %
Neutro Abs: 7.9 10*3/uL — ABNORMAL HIGH (ref 1.7–7.7)
Neutrophils Relative %: 65 %
Platelets: 183 10*3/uL (ref 150–400)
RBC: 3.64 MIL/uL — ABNORMAL LOW (ref 3.87–5.11)
RDW: 18.5 % — ABNORMAL HIGH (ref 11.5–15.5)
Smear Review: NORMAL
WBC: 12.2 10*3/uL — ABNORMAL HIGH (ref 4.0–10.5)
nRBC: 0.2 % (ref 0.0–0.2)

## 2020-11-07 LAB — GLUCOSE, CAPILLARY
Glucose-Capillary: 104 mg/dL — ABNORMAL HIGH (ref 70–99)
Glucose-Capillary: 87 mg/dL (ref 70–99)
Glucose-Capillary: 96 mg/dL (ref 70–99)
Glucose-Capillary: 81 mg/dL (ref 70–99)
Glucose-Capillary: 106 mg/dL — ABNORMAL HIGH (ref 70–99)
Glucose-Capillary: 52 mg/dL — ABNORMAL LOW (ref 70–99)

## 2020-11-07 LAB — COMPREHENSIVE METABOLIC PANEL
ALT: 62 U/L — ABNORMAL HIGH (ref 0–44)
AST: 107 U/L — ABNORMAL HIGH (ref 15–41)
Albumin: 2 g/dL — ABNORMAL LOW (ref 3.5–5.0)
Alkaline Phosphatase: 193 U/L — ABNORMAL HIGH (ref 38–126)
Anion gap: 12 (ref 5–15)
BUN: 49 mg/dL — ABNORMAL HIGH (ref 6–20)
CO2: 19 mmol/L — ABNORMAL LOW (ref 22–32)
Calcium: 8.8 mg/dL — ABNORMAL LOW (ref 8.9–10.3)
Chloride: 105 mmol/L (ref 98–111)
Creatinine, Ser: 3.76 mg/dL — ABNORMAL HIGH (ref 0.44–1.00)
GFR, Estimated: 13 mL/min — ABNORMAL LOW (ref >60)
Glucose, Bld: 94 mg/dL (ref 70–99)
Potassium: 4 mmol/L (ref 3.5–5.1)
Sodium: 136 mmol/L (ref 135–145)
Total Bilirubin: 1.3 mg/dL — ABNORMAL HIGH (ref 0.3–1.2)
Total Protein: 6.8 g/dL (ref 6.5–8.1)

## 2020-11-07 LAB — BLOOD GAS, ARTERIAL
Acid-base deficit: 5.4 mmol/L — ABNORMAL HIGH (ref 0.0–2.0)
Bicarbonate: 18.9 mmol/L — ABNORMAL LOW (ref 20.0–28.0)
FIO2: 0.21
O2 Saturation: 92.3 %
Patient temperature: 37
pCO2 arterial: 32 mmHg (ref 32.0–48.0)
pH, Arterial: 7.38 (ref 7.350–7.450)
pO2, Arterial: 66 mmHg — ABNORMAL LOW (ref 83.0–108.0)

## 2020-11-07 LAB — HEPATITIS B CORE ANTIBODY, TOTAL: Hep B Core Total Ab: NONREACTIVE

## 2020-11-07 LAB — IRON AND TIBC
Iron: 14 ug/dL — ABNORMAL LOW (ref 28–170)
Saturation Ratios: 6 % — ABNORMAL LOW (ref 10.4–31.8)
TIBC: 242 ug/dL — ABNORMAL LOW (ref 250–450)
UIBC: 228 ug/dL

## 2020-11-07 LAB — RAPID HIV SCREEN (HIV 1/2 AB+AG)
HIV 1/2 Antibodies: NONREACTIVE
HIV-1 P24 Antigen - HIV24: NONREACTIVE

## 2020-11-07 LAB — AMMONIA: Ammonia: 27 umol/L (ref 9–35)

## 2020-11-07 LAB — HEPATITIS B CORE ANTIBODY, IGM: Hep B C IgM: NONREACTIVE

## 2020-11-07 LAB — FERRITIN: Ferritin: 53 ng/mL (ref 11–307)

## 2020-11-07 LAB — HEPATITIS B SURFACE ANTIGEN: Hepatitis B Surface Ag: NONREACTIVE

## 2020-11-07 LAB — HEPATITIS B SURFACE ANTIBODY,QUALITATIVE: Hep B S Ab: NONREACTIVE

## 2020-11-07 LAB — URIC ACID: Uric Acid, Serum: 4.6 mg/dL (ref 2.5–7.1)

## 2020-11-07 MED ORDER — INSULIN DETEMIR 100 UNIT/ML ~~LOC~~ SOLN
30.0000 [IU] | Freq: Two times a day (BID) | SUBCUTANEOUS | Status: DC
  Administered 2020-11-08: 22:00:00 30 [IU] via SUBCUTANEOUS
  Administered 2020-11-08: 10:00:00 15 [IU] via SUBCUTANEOUS
  Filled 2020-11-07 (×5): qty 0.3

## 2020-11-07 MED ORDER — SODIUM CHLORIDE 0.9 % IV SOLN
500.0000 mg | INTRAVENOUS | Status: AC
  Administered 2020-11-08 – 2020-11-10 (×3): 500 mg via INTRAVENOUS
  Filled 2020-11-07 (×3): qty 500

## 2020-11-07 MED ORDER — SODIUM CHLORIDE 0.9 % IV SOLN: INTRAVENOUS | Status: DC

## 2020-11-07 MED ORDER — ADULT MULTIVITAMIN W/MINERALS CH
1.0000 | ORAL_TABLET | Freq: Every day | ORAL | Status: DC
  Administered 2020-11-08 – 2020-11-15 (×8): 1 via ORAL
  Filled 2020-11-07 (×8): qty 1

## 2020-11-07 MED ORDER — AZITHROMYCIN 500 MG PO TABS: 500.0000 mg | ORAL_TABLET | Freq: Every day | ORAL | Status: DC

## 2020-11-07 MED ORDER — ENSURE ENLIVE PO LIQD
237.0000 mL | Freq: Three times a day (TID) | ORAL | Status: DC
  Administered 2020-11-08 – 2020-11-12 (×6): 237 mL via ORAL

## 2020-11-07 MED ORDER — ALLOPURINOL 100 MG PO TABS
200.0000 mg | ORAL_TABLET | Freq: Every day | ORAL | Status: DC
  Administered 2020-11-08: 10:00:00 200 mg via ORAL
  Filled 2020-11-07: qty 2

## 2020-11-07 MED ORDER — PREGABALIN 25 MG PO CAPS: 25.0000 mg | ORAL_CAPSULE | Freq: Three times a day (TID) | ORAL | Status: DC

## 2020-11-07 MED ORDER — DEXTROSE-NACL 5-0.9 % IV SOLN: INTRAVENOUS | Status: DC

## 2020-11-07 MED ORDER — INSULIN ASPART 100 UNIT/ML ~~LOC~~ SOLN
5.0000 [IU] | Freq: Three times a day (TID) | SUBCUTANEOUS | Status: DC
  Administered 2020-11-08: 5 [IU] via SUBCUTANEOUS
  Filled 2020-11-07: qty 1

## 2020-11-07 MED ORDER — DEXTROSE 10 % IV SOLN: INTRAVENOUS | Status: DC

## 2020-11-07 MED ORDER — DEXTROSE 50 % IV SOLN
1.0000 | INTRAVENOUS | Status: AC
  Administered 2020-11-07: 50 mL via INTRAVENOUS
  Filled 2020-11-07: qty 50

## 2020-11-07 NOTE — Progress Notes (Signed)
Inpatient Diabetes Program Recommendations  AACE/ADA: New Consensus Statement on Inpatient Glycemic Control (2015)  Target Ranges:  Prepandial:   less than 140 mg/dL      Peak postprandial:   less than 180 mg/dL (1-2 hours)      Critically ill patients:  140 - 180 mg/dL   Lab Results  Component Value Date   GLUCAP 104 (H) 11/07/2020   HGBA1C 10.8 (H) 11/06/2020    Review of Glycemic Control Results for Tina Gregory, Tina Gregory (MRN [id number]) as of 11/07/2020 11:39  Ref. Range 11/06/2020 07:47 11/06/2020 11:34 11/06/2020 12:55 11/06/2020 16:31 11/06/2020 20:18 11/07/2020 08:19  Glucose-Capillary Latest Ref Range: 70 - 99 mg/dL 202 (H) 334 (H) 356 (H) 295 (H) 187 (H) 104 (H)   Inpatient Diabetes Program Recommendations:   -Decrease Levemir to 30 units bid -Decrease Novolog meal coverage to 5 units tid if eats 50% meals Secure chat sent to Dr. Denton Lank  Thank you, Billy Fischer. Connee Ikner, RN, MSN, CDE  Diabetes Coordinator Inpatient Glycemic Control Team Team Pager [phone] (8am-5pm) 11/07/2020 11:43 AM

## 2020-11-07 NOTE — Progress Notes (Signed)
PT Cancellation Note  Patient Details Name: Tina Gregory MRN: [id number] DOB: 1961-03-10   Cancelled Treatment:    Reason Eval/Treat Not Completed: Fatigue/lethargy limiting ability to participate (Consult received and chart reviewed.  Patient just completed OT evaluation; notably fatigued at this time.  Will re-attempt at later time/date as medically appropriate and available.)   .Carrine Kroboth H. Manson Passey, PT, DPT, NCS 11/07/20, 10:31 AM [phone]

## 2020-11-07 NOTE — Progress Notes (Signed)
Initial Nutrition Assessment  DOCUMENTATION CODES:   Morbid obesity  INTERVENTION:  Provide Ensure Enlive po TID with meals, each supplement provides 350 kcal and 20 grams of protein.  Pt with poor oral intake and would benefit from nutrient dense supplement. One Ensure Enlive supplement provides 350 kcals, 20 grams protein, and 44-45 grams of carbohydrate vs one Glucerna shake supplement, which provides 220 kcals, 10 grams of protein, and 26 grams of carbohydrate. Given pt's hx of DM, RD will reassess adequacy of PO intake, CBGS, and adjust supplement regimen as appropriate at follow-up.   Provide MVI po daily.  NUTRITION DIAGNOSIS:   Inadequate oral intake related to lethargy/confusion as evidenced by meal completion < 50%.  GOAL:   Patient will meet greater than or equal to 90% of their needs  MONITOR:   PO intake, Supplement acceptance, Labs, Weight trends, I & O's  REASON FOR ASSESSMENT:   Malnutrition Screening Tool    ASSESSMENT:   59 year old female with PMHx of DM, HTN, HLD, CVA admitted with sepsis, PNA, AKI.   Met with patient at bedside today. She was very sleepy/lethargic. She woke up briefly to answer that her appetite was good but then fell back asleep and would not answer any other questions. No meal documentation recorded in chart. Patient's tray at bedside appeared untouched - patient likely slept through meal.  Patient unable to provide weight history and no prior weights available in chart. Patient is currently 109.3 kg (241 lbs).  Medications reviewed and include: allopurinol, azithromycin, Novolog 0-15 units TID, Novolog 0-5 units QHS, Novolog 5 units TID with meals, Levemir 30 units BID, levothyroxine, NS at 75 mL/hr, ceftriaxone.  Labs reviewed: CBG 87-295, CO2 19, BUN 49, Creatinine 3.76.  Patient does not meet criteria for malnutrition at this time but nutrition and weight history is unknown.  NUTRITION - FOCUSED PHYSICAL EXAM:    Most Recent  Value  Orbital Region No depletion  Upper Arm Region No depletion  Thoracic and Lumbar Region No depletion  Buccal Region No depletion  Temple Region No depletion  Clavicle Bone Region No depletion  Clavicle and Acromion Bone Region No depletion  Scapular Bone Region Unable to assess  Dorsal Hand No depletion  Patellar Region No depletion  Anterior Thigh Region No depletion  Posterior Calf Region No depletion  Edema (RD Assessment) Mild  Hair Reviewed  Eyes Reviewed  Mouth Reviewed  Skin Reviewed  Nails Reviewed     Diet Order:   Diet Order            Diet heart healthy/carb modified Room service appropriate? Yes; Fluid consistency: Thin  Diet effective now                EDUCATION NEEDS:   No education needs have been identified at this time  Skin:  Skin Assessment: Reviewed RN Assessment  Last BM:  11/06/2020 - large type 3  Height:   Ht Readings from Last 1 Encounters:  11/05/20 _0  (1.651 m)   Weight:   Wt Readings from Last 1 Encounters:  11/05/20 109.3 kg   BMI:  Body mass index is 40.1 kg/m.  Estimated Nutritional Needs:   Kcal:  2200-2400  Protein:  110-120 grams  Fluid:  2.2 L/day  Jacklynn Barnacle, MS, RD, LDN Pager number available on Amion

## 2020-11-07 NOTE — Evaluation (Signed)
Occupational Therapy Evaluation Patient Details Name: Tina Gregory MRN: [id number] DOB: [birth year] Today's Date: 11/07/2020    History of Present Illness Pt is 59 y/o F with PMH: DM, HTN, HLD, diabetic neuropathy, and CVA. Pt brought to [facility] ED on 11/05/20 by family secondary to generalized weakness and a fall while trying to get in a wheelchair in [place] parking lot. Pt admitted d/t community-acquired pneumonia and AKI.   Clinical Impression   Pt seen for OT evaluation this date in setting of acute hospitalization for PNA and AKI. Pt reports being MOD I with fxl mobility at baseline with RW and able to perform BADLs and some driving (some help for IADLs and transportation from family per pt). Pt is intermittently drowsy during session and is somewhat questionable historian. On assessment, pt able to perform sup to sit with MIN A, requires MIN/MOD A for sit to stand with RW from elevated surface. She demos decreased standing tolerance and balance with knee buckling and quickly descends back to bed with little to no eccentric control, stating "my knees just give out". Pt requires MAX A for scooting/repositioning and then for sit to sup to get back to bed. She requires MOD A for LB dressing while seated EOB, and at least SETUP to MIN A for seated UB ADLs d/t poor static sitting balance. Pt would benefit from skilled OT to improve safety with ADLs/ADL mobility. Anticipate she will require STR upon d/c d/t decreased functional level versus her baseline, weakness, and fall risk.     Follow Up Recommendations  SNF    Equipment Recommendations  Other (comment) (defer to next level of care)    Recommendations for Other Services       Precautions / Restrictions Precautions Precautions: Fall Restrictions Weight Bearing Restrictions: No      Mobility Bed Mobility Overal bed mobility: Needs Assistance Bed Mobility: Supine to Sit;Sit to Supine     Supine to sit: Min assist;HOB elevated Sit to  supine: Max assist   General bed mobility comments: MIN A with use of rails and HOB elevated for sup to sit, pt gets fatigued and lays back before assuming appropraite position to perform sup to sit to get back in bed, anticiapte MAX A +2 would have been more appropriate (called for assist, but RN/CNA unable to come at that time)    Transfers Overall transfer level: Needs assistance Equipment used: Rolling walker (2 wheeled) Transfers: Sit to/from Stand Sit to Stand: Min assist;Mod assist;From elevated surface         General transfer comment: MIN/MOD A to CTS and pt tolerates well, but immediately knees buckle and pt is unable to control descent back to bed. Pt was wanting to attempt SPS transfer to [facility], but unsafe at this time. Unable to test orthostatics, but pt states fall is unrelated to dizziness and rather d/t "knees give out"    Balance Overall balance assessment: Needs assistance Sitting-balance support: Feet supported;Bilateral upper extremity supported Sitting balance-Leahy Scale: Poor Sitting balance - Comments: pt with G static sitting balance initially, but intermittently appears fatigued/drowsy and starts laterally leaning to her left. Does make attempt to self-correct, but requires MOD A from OT     Standing balance-Leahy Scale: Zero Standing balance comment: While pt is able to CTS with RW with MIN/MOD A, knee buckling impacts her ability to sustain static stand safely and pt ultimately does not control descent back to bed, flops down and requires extensive assist and use of rails to scoot back  towards middle of bed before returning to supine                           ADL either performed or assessed with clinical judgement   ADL Overall ADL's : Needs assistance/impaired                                       General ADL Comments: MIN/MOD A with seated UB ADLs, MOD A with seated LB ADLs including donning socks.     Vision Patient Visual  Report: No change from baseline       Perception     Praxis      Pertinent Vitals/Pain Pain Assessment: No/denies pain     Hand Dominance     Extremity/Trunk Assessment Upper Extremity Assessment Upper Extremity Assessment: Generalized weakness (difficulty lifting and maintaining against gravity this date. Grip MMT grossly 3/5.)   Lower Extremity Assessment Lower Extremity Assessment: Generalized weakness       Communication Communication Communication: No difficulties   Cognition Arousal/Alertness: Lethargic Behavior During Therapy: WFL for tasks assessed/performed Overall Cognitive Status: No family/caregiver present to determine baseline cognitive functioning                                 General Comments: Pt is oriented to month, year and place, but not date/time and her answers are delayed/requires increased processing time/reposnse time. Pt is intermittently lethargic during session-she is awake/appropriate at times and drowsy/closing eyes other times.   General Comments       Exercises Other Exercises Other Exercises: OT facilitates ed re: role of OT in acute setting, importance of OOB Activity, bed level UB therex that pt can perform outside of therapy time to maintain/improve core and UE strength for balance, use of AD and general safety. Pt with moderate reception of education, but will require f/u, especially for therex routine.   Shoulder Instructions      Home Living Family/patient expects to be discharged to:: Private residence Living Arrangements: Children (daughter "sometimes") Available Help at Discharge: Family;Available PRN/intermittently (reports daughter works, pt reports dtr lives with her "sometimes") Type of Home: House Home Access: Stairs to enter [employer] of Steps: 3 Entrance Stairs-Rails: Right Home Layout: One level     Bathroom Shower/Tub: Tub/shower unit         Home Equipment: Environmental consultant - 2  wheels;Bedside commode;Shower seat          Prior Functioning/Environment Level of Independence: Independent with assistive device(s)        Comments: Pt is questionable historian. She reports she is usually able to transfer herself and able to perform BADLs I'ly, some family assist for IADLs. States she "can drive" but that her daughter usually drives her. Endorses several falls in last year. States her knees just "give out"        OT Problem List: Decreased strength;Decreased activity tolerance;Impaired balance (sitting and/or standing);Decreased safety awareness;Decreased knowledge of use of DME or AE;Cardiopulmonary status limiting activity;Obesity      OT Treatment/Interventions: Self-care/ADL training;Therapeutic exercise;DME and/or AE instruction;Therapeutic activities;Balance training;Patient/family education    OT Goals(Current goals can be found in the care plan section) Acute Rehab OT Goals Patient Stated Goal: to go home OT Goal Formulation: With patient Time For Goal Achievement: 11/21/20 Potential to Achieve Goals: Fair  ADL Goals Pt Will Perform Lower Body Dressing: with min guard assist;sit to/from stand (with AE PRN) Pt Will Transfer to Toilet: with min assist;stand pivot transfer;bedside commode (with RW) Pt Will Perform Toileting - Clothing Manipulation and hygiene: with min assist;sit to/from stand Pt/caregiver will Perform Home Exercise Program: Increased strength;Both right and left upper extremity;With Supervision  OT Frequency: Min 1X/week   Barriers to D/C:            Co-evaluation              AM-PAC OT "6 Clicks" Daily Activity     Outcome Measure Help from another person eating meals?: None Help from another person taking care of personal grooming?: A Little Help from another person toileting, which includes using toliet, bedpan, or urinal?: A Lot Help from another person bathing (including washing, rinsing, drying)?: A Lot Help from  another person to put on and taking off regular upper body clothing?: A Little Help from another person to put on and taking off regular lower body clothing?: A Lot 6 Click Score: 16   End of Session Equipment Utilized During Treatment: Gait belt;Rolling walker Nurse Communication: Mobility status;Other (comment) (notfied RN and CNA about unpredictable knee buckling)  Activity Tolerance: Patient tolerated treatment well;Patient limited by fatigue Patient left: in bed;with call bell/phone within reach;with bed alarm set  OT Visit Diagnosis: Unsteadiness on feet (R26.81);Muscle weakness (generalized) (M62.81)                Time: 6063-0160 OT Time Calculation (min): 39 min Charges:  OT General Charges $OT Visit: 1 Visit OT Evaluation $OT Eval Moderate Complexity: 1 Mod OT Treatments $Self Care/Home Management : 8-22 mins $Therapeutic Activity: 8-22 mins  Rejeana Brock, MS, OTR/L ascom [phone] 11/07/20, 4:30 PM

## 2020-11-07 NOTE — Significant Event (Signed)
Rapid Response Event Note   Reason for Call :  Altered mental status  Initial Focused Assessment:  Bedside RN stated that patient normally alert and oriented started to have confusion and less responsive.  When RR arrived patient more responsive and following commands though still a bit lethargic- Dr. Denton Lank made aware.       Interventions:  CT of head ordered stat.  Plan of Care:  Per Dr. Denton Lank- she will reassess patient after the CT scan becomes resulted and order blood workup.   Event Summary:   MD Notified:  Call Time: Arrival Time: End Time:  Williemae Natter, RN

## 2020-11-07 NOTE — Progress Notes (Signed)
PT Cancellation Note  Patient Details Name: Tina Gregory MRN: [id number] DOB: August 08, 1961   Cancelled Treatment:    Reason Eval/Treat Not Completed:  (Chart reviewed for re-attempt at PT evaluation. Noted recent rapid response event.  Will hold until next date to allow continued work up and ensure medical stability prior to exertional activity.)  Benigna Delisi H. Manson Passey, PT, DPT, NCS 11/07/20, 4:19 PM [phone]

## 2020-11-07 NOTE — Progress Notes (Signed)
Went in room to find patient sleeping. Tried to wake patient to eat lunch and she would not arouse. Did sternum rub and patient woke up barely. Patient was lethargic and acting different from the morning. Patients daughter spoke to her mother on the phone when I was in there and she said that her mother sounded worse to her. Patient was A/O x4 but could not do a neuro assessment or NIH with me correctly. Called a rapid, MD aware. New order for ammonia level and stat CT of head. Will continue to monitor.

## 2020-11-07 NOTE — Consult Note (Signed)
[facility]  CONSULT NOTE    Date: 11/07/2020                  Patient Name:  Tina Gregory  MRN: [id number]  DOB: 04-13-1961  Age / Sex: 59 y.o., female         PCP: Pcp, No                 Service Requesting Consult: Dr. Denton Lank                 Reason for Consult: Acute kidney injury            History of Present Illness: Tina Gregory admitted after a fall in the [employer] parking lot. Patient was found to have bilateral lower lobe pneumonia. She was started empirically on ceftriaxone and azithromycin.   Patient's creatinine was 2.96 on admission and today is 3.76. Nephrology consulted. No known baseline creatinine available.   Patient is lethargic and unable to give much of a history. She is not eating.    Medications: Outpatient medications: Medications Prior to Admission  Medication Sig Dispense Refill Last Dose  . allopurinol (ZYLOPRIM) 300 MG tablet Take 300 mg by mouth daily.   11/04/2020 at Unknown time  . amitriptyline (ELAVIL) 50 MG tablet Take 50 mg by mouth at bedtime.   11/04/2020 at Unknown time  . aspirin EC 81 MG tablet Take 81 mg by mouth daily. Swallow whole.   11/04/2020 at Unknown time  . carvedilol (COREG) 25 MG tablet Take 25 mg by mouth 2 (two) times daily.   11/04/2020 at Unknown time  . cloNIDine (CATAPRES) 0.2 MG tablet Take 0.2 mg by mouth 2 (two) times daily.   11/04/2020 at Unknown time  . clopidogrel (PLAVIX) 75 MG tablet Take 75 mg by mouth daily.   11/04/2020 at Unknown time  . clotrimazole-betamethasone (LOTRISONE) cream Apply 1 application topically as directed.   11/04/2020 at Unknown time  . furosemide (LASIX) 20 MG tablet Take 20 mg by mouth 2 (two) times daily.   11/04/2020 at Unknown time  . LEVEMIR FLEXTOUCH 100 UNIT/ML FlexPen Inject 35 Units into the skin 2 (two) times daily.    11/04/2020 at Unknown time  . levothyroxine (SYNTHROID) 25 MCG tablet Take 25 mcg by mouth daily.   11/04/2020 at Unknown time  .  losartan (COZAAR) 50 MG tablet Take 50 mg by mouth daily.   11/04/2020 at Unknown time  . NOVOLOG FLEXPEN 100 UNIT/ML FlexPen Inject 20-25 Units into the skin 3 (three) times daily with meals. 25 in am 20 afternoon 25 evening   11/04/2020 at Unknown time  . ondansetron (ZOFRAN) 4 MG tablet Take 4 mg by mouth every 8 (eight) hours as needed.   11/04/2020 at Unknown time  . pantoprazole (PROTONIX) 40 MG tablet Take 40 mg by mouth daily.   11/04/2020 at Unknown time  . pregabalin (LYRICA) 100 MG capsule Take 100 mg by mouth 3 (three) times daily.   11/04/2020 at Unknown time  . rosuvastatin (CRESTOR) 20 MG tablet Take 20 mg by mouth at bedtime.   11/04/2020 at Unknown time  . TRULICITY 0.75 MG/0.5ML SOPN Inject 0.75 mg into the skin once a week. Takes on weds.   Past Week at Unknown time    Current medications: Current Facility-Administered Medications  Medication Dose Route Frequency Provider Last Rate Last Admin  . [START ON 11/08/2020] allopurinol (ZYLOPRIM) tablet 200 mg  200 mg Oral Daily Denton Lank,  Kelly A, DO      . amitriptyline (ELAVIL) tablet 50 mg  50 mg Oral QHS Esaw Grandchild A, DO   50 mg at 11/06/20 2122  . aspirin EC tablet 81 mg  81 mg Oral Daily Esaw Grandchild A, DO   81 mg at 11/07/20 0934  . azithromycin (ZITHROMAX) 500 mg in sodium chloride 0.9 % 250 mL IVPB  500 mg Intravenous Q24H Earlie Lou L, MD   Stopping Infusion hung by another clincian at 11/07/20 0117  . carvedilol (COREG) tablet 25 mg  25 mg Oral BID Esaw Grandchild A, DO   25 mg at 11/07/20 0935  . cefTRIAXone (ROCEPHIN) 2 g in sodium chloride 0.9 % 100 mL IVPB  2 g Intravenous Q24H Earlie Lou L, MD 200 mL/hr at 11/07/20 0956 2 g at 11/07/20 0956  . cloNIDine (CATAPRES) tablet 0.2 mg  0.2 mg Oral BID Esaw Grandchild A, DO   0.2 mg at 11/07/20 0935  . clopidogrel (PLAVIX) tablet 75 mg  75 mg Oral Daily Esaw Grandchild A, DO   75 mg at 11/07/20 0935  . enoxaparin (LOVENOX) injection 40 mg  40 mg  Subcutaneous Q24H Earlie Lou L, MD   40 mg at 11/07/20 0934  . guaiFENesin-dextromethorphan (ROBITUSSIN DM) 100-10 MG/5ML syrup 5 mL  5 mL Oral Q4H PRN Esaw Grandchild A, DO      . insulin aspart (novoLOG) injection 0-15 Units  0-15 Units Subcutaneous TID WC Rometta Emery, MD   8 Units at 11/06/20 1719  . insulin aspart (novoLOG) injection 0-5 Units  0-5 Units Subcutaneous QHS Mikeal Hawthorne, Mohammad L, MD      . insulin aspart (novoLOG) injection 5 Units  5 Units Subcutaneous TID WC Esaw Grandchild A, DO      . insulin detemir (LEVEMIR) injection 30 Units  30 Units Subcutaneous BID Esaw Grandchild A, DO      . levothyroxine (SYNTHROID) tablet 25 mcg  25 mcg Oral Daily Esaw Grandchild A, DO   25 mcg at 11/07/20 3716  . pregabalin (LYRICA) capsule 25 mg  25 mg Oral TID Esaw Grandchild A, DO          Allergies: Allergies  Allergen Reactions  . Lisinopril     Headache, shaking  . Shellfish Allergy Hives      Past Medical History: Past Medical History:  Diagnosis Date  . Diabetes mellitus without complication (HCC)   . Hyperlipidemia   . Hypertension   . Neuropathy   . Stroke [facility] [place])      Past Surgical History: History reviewed. No pertinent surgical history.   Family History: History reviewed. No pertinent family history.   Social History: Social History   Socioeconomic History  . Marital status: Single    Spouse name: Not on file  . Number of children: Not on file  . Years of education: Not on file  . Highest education level: Not on file  Occupational History  . Not on file  Tobacco Use  . Smoking status: Current Every Day Smoker    Packs/day: 0.50    Types: Cigarettes  . Smokeless tobacco: Never Used  Vaping Use  . Vaping Use: Never used  Substance and Sexual Activity  . Alcohol use: Never  . Drug use: Never  . Sexual activity: Not on file  Other Topics Concern  . Not on file  Social History Narrative  . Not on file   Social Determinants of Health    Financial Resource Strain:   .  Difficulty of Paying Living Expenses: Not on file  Food Insecurity:   . Worried About Programme researcher, broadcasting/film/video in the Last Year: Not on file  . Ran Out of Food in the Last Year: Not on file  Transportation Needs:   . Lack of Transportation (Medical): Not on file  . Lack of Transportation (Non-Medical): Not on file  Physical Activity:   . Days of Exercise per Week: Not on file  . Minutes of Exercise per Session: Not on file  Stress:   . Feeling of Stress : Not on file  Social Connections:   . Frequency of Communication with Friends and Family: Not on file  . Frequency of Social Gatherings with Friends and Family: Not on file  . Attends Religious Services: Not on file  . Active Member of Clubs or Organizations: Not on file  . Attends Banker Meetings: Not on file  . Marital Status: Not on file  Intimate Partner Violence:   . Fear of Current or Ex-Partner: Not on file  . Emotionally Abused: Not on file  . Physically Abused: Not on file  . Sexually Abused: Not on file     Review of Systems: Review of Systems  Unable to perform ROS: Mental status change    Vital Signs: Blood pressure 105/63, pulse 75, temperature 97.7 F (36.5 C), temperature source Oral, resp. rate 14, height 5\' 5"  (1.651 m), weight 109.3 kg, SpO2 94 %.  Weight trends: Filed Weights   11/05/20 1423  Weight: 109.3 kg    Physical Exam: General: NAD, laying in bed  Head: Normocephalic, atraumatic. Moist oral mucosal membranes  Eyes: Anicteric, PERRL  Neck: Supple, trachea midline  Lungs:  Clear to auscultation  Heart: Regular rate and rhythm  Abdomen:  Soft, nontender,   Extremities:  no peripheral edema.  Neurologic: Lethargic  Skin: No lesions         Lab results: Basic Metabolic Panel: Recent Labs  Lab 11/05/20 1402 11/06/20 0046 11/07/20 0933  NA 134* 135 136  K 3.8 3.7 4.0  CL 102 107 105  CO2 20* 18* 19*  GLUCOSE 151* 221* 94  BUN 44* 44*  49*  CREATININE 2.96* 2.96* 3.76*  CALCIUM 9.0 8.4* 8.8*    Liver Function Tests: Recent Labs  Lab 11/06/20 0046 11/07/20 0933  AST 64* 107*  ALT 53* 62*  ALKPHOS 132* 193*  BILITOT 1.1 1.3*  PROT 7.0 6.8  ALBUMIN 2.3* 2.0*   No results for input(s): LIPASE, AMYLASE in the last 168 hours. No results for input(s): AMMONIA in the last 168 hours.  CBC: Recent Labs  Lab 11/05/20 1402 11/06/20 0046 11/07/20 0933  WBC 17.8* 16.8* 12.2*  NEUTROABS  --  12.0* 7.9*  HGB 11.5* 9.8* 9.5*  HCT 35.4* 30.8* 29.8*  MCV 81.8 81.7 81.9  PLT 196 174 183    Cardiac Enzymes: No results for input(s): CKTOTAL, CKMB, CKMBINDEX, TROPONINI in the last 168 hours.  BNP: Invalid input(s): POCBNP  CBG: Recent Labs  Lab 11/06/20 1631 11/06/20 2018 11/07/20 0819 11/07/20 1253 11/07/20 1321  GLUCAP 295* 187* 104* 87 96    Microbiology: Results for orders placed or performed during the hospital encounter of 11/05/20  Resp Panel by RT-PCR (Flu A&B, Covid) Nasopharyngeal Swab     Status: None   Collection Time: 11/05/20  9:36 PM   Specimen: Nasopharyngeal Swab; Nasopharyngeal(NP) swabs in vial transport medium  Result Value Ref Range Status   SARS Coronavirus 2 by RT PCR  NEGATIVE NEGATIVE Final    Comment: (NOTE) SARS-CoV-2 target nucleic acids are NOT DETECTED.  The SARS-CoV-2 RNA is generally detectable in upper respiratory specimens during the acute phase of infection. The lowest concentration of SARS-CoV-2 viral copies this assay can detect is 138 copies/mL. A negative result does not preclude SARS-Cov-2 infection and should not be used as the sole basis for treatment or other patient management decisions. A negative result may occur with  improper specimen collection/handling, submission of specimen other than nasopharyngeal swab, presence of viral mutation(s) within the areas targeted by this assay, and inadequate number of viral copies(<138 copies/mL). A negative result must  be combined with clinical observations, patient history, and epidemiological information. The expected result is Negative.  Fact Sheet for Patients:  BloggerCourse.com  Fact Sheet for Healthcare Providers:  SeriousBroker.it  This test is no t yet approved or cleared by the [place] FDA and  has been authorized for detection and/or diagnosis of SARS-CoV-2 by FDA under an Emergency Use Authorization (EUA). This EUA will remain  in effect (meaning this test can be used) for the duration of the COVID-19 declaration under Section 564(b)(1) of the Act, 21 U.S.C.section 360bbb-3(b)(1), unless the authorization is terminated  or revoked sooner.       Influenza A by PCR NEGATIVE NEGATIVE Final   Influenza B by PCR NEGATIVE NEGATIVE Final    Comment: (NOTE) The Xpert Xpress SARS-CoV-2/FLU/RSV plus assay is intended as an aid in the diagnosis of influenza from Nasopharyngeal swab specimens and should not be used as a sole basis for treatment. Nasal washings and aspirates are unacceptable for Xpert Xpress SARS-CoV-2/FLU/RSV testing.  Fact Sheet for Patients: BloggerCourse.com  Fact Sheet for Healthcare Providers: SeriousBroker.it  This test is not yet approved or cleared by the [place] FDA and has been authorized for detection and/or diagnosis of SARS-CoV-2 by FDA under an Emergency Use Authorization (EUA). This EUA will remain in effect (meaning this test can be used) for the duration of the COVID-19 declaration under Section 564(b)(1) of the Act, 21 U.S.C. section 360bbb-3(b)(1), unless the authorization is terminated or revoked.  Performed at [facility], [address] [place], [place] [address]   Culture, blood (routine x 2) Call MD if unable to obtain prior to antibiotics being given     Status: None (Preliminary result)   Collection Time: 11/06/20 12:46 AM    Specimen: BLOOD  Result Value Ref Range Status   Specimen Description BLOOD LEFT ANTECUBITAL  Final   Special Requests   Final    BOTTLES DRAWN AEROBIC AND ANAEROBIC Blood Culture adequate volume   Culture   Final    NO GROWTH 1 DAY Performed at [facility], [address] [place], [place] [address]    Report Status PENDING  Incomplete  Culture, blood (routine x 2) Call MD if unable to obtain prior to antibiotics being given     Status: None (Preliminary result)   Collection Time: 11/06/20 12:46 AM   Specimen: BLOOD  Result Value Ref Range Status   Specimen Description BLOOD BLOOD LEFT FOREARM  Final   Special Requests   Final    BOTTLES DRAWN AEROBIC AND ANAEROBIC Blood Culture adequate volume   Culture   Final    NO GROWTH 1 DAY Performed at [facility], [address] [place], [place] [address]    Report Status PENDING  Incomplete    Coagulation Studies: No results for input(s): LABPROT, INR in the last 72 hours.  Urinalysis: Recent Labs    11/05/20 1727  COLORURINE YELLOW*  LABSPEC 1.017  PHURINE 5.0  GLUCOSEU >=500*  HGBUR SMALL*  BILIRUBINUR NEGATIVE  KETONESUR NEGATIVE  PROTEINUR >=300*  NITRITE NEGATIVE  LEUKOCYTESUR NEGATIVE      Imaging: DG Chest 2 View  Result Date: 11/05/2020 CLINICAL DATA:  Weakness, cough, fall EXAM: CHEST - 2 VIEW COMPARISON:  None. FINDINGS: Heart size appears mildly enlarged. Atherosclerotic calcification of the aortic knob. Streaky right basilar opacities. No pleural effusion. No pneumothorax. IMPRESSION: 1. Streaky right basilar opacities, atelectasis versus infiltrate. 2. Mild cardiomegaly. Electronically Signed   By: Duanne GuessNicholas  Plundo D.O.   On: 11/05/2020 17:52   CT HEAD WO CONTRAST  Result Date: 11/05/2020 CLINICAL DATA:  Head trauma, coagulopathy EXAM: CT HEAD WITHOUT CONTRAST TECHNIQUE: Contiguous axial images were obtained from the base of the skull through the vertex without intravenous  contrast. COMPARISON:  None. FINDINGS: Brain: Scattered moderate white matter microvascular ischemic changes throughout both cerebral hemispheres. No acute intracranial hemorrhage, new infarction, mass lesion, midline shift, herniation, hydrocephalus, or extra-axial fluid collection. No focal mass effect or edema. Cisterns are patent. No cerebellar abnormality. Vascular: No hyperdense vessel or unexpected calcification. Skull: Normal. Negative for fracture or focal lesion. Sinuses/Orbits: No acute finding. Other: None. IMPRESSION: Chronic white matter microvascular ischemic changes. No acute intracranial abnormality by noncontrast CT. Electronically Signed   By: Judie PetitM.  Shick M.D.   On: 11/05/2020 15:00   DG Knee Complete 4 Views Left  Result Date: 11/05/2020 CLINICAL DATA:  Left knee pain, EXAM: LEFT KNEE - COMPLETE 4+ VIEW COMPARISON:  None. FINDINGS: No evidence of fracture, dislocation, or joint effusion. No evidence of arthropathy or other focal bone abnormality. There is diffuse soft tissue swelling. Mild medial compartment osteoarthritis is noted. IMPRESSION: No acute osseous abnormality. Diffuse soft tissue swelling. Electronically Signed   By: Jonna ClarkBindu  Avutu M.D.   On: 11/05/2020 17:54      Assessment & Plan: Tina Gregory is a 59 y.o. black female with diabetes mellitus type II insulin dependent, hypertension, diabetic neuropathy, CVA, hyperlipidemia, who [facility]2020 for CAP (community acquired pneumonia) [J18.9] AKI (acute kidney injury) (HCC) [N17.9] Community acquired pneumonia of right lower lobe of lung [J18.9]  1. Acute kidney injury with proteinuria: with unknown baseline creatinine. Pending renal ultrasound. Urinalysis indicative of underlying diabetic nephropathy.  No IV contrast exposure - Consider IV fluids since patient is not taking in PO. - Holding losartan and furosemide.   2. Hypertension: blood pressure soft. Current regimen of carvedilol and  clonidine  3. Diabetes mellitus type II with chronic kidney disease: insulin dependent. Not well controlled. Hemoglobin A1c of 10.8%.   4. Anemia with renal failure: hemoglobin 9.5, normocytic.  - Check iron studies.   5. Altered mental status:  - discontinue pregabalin.    LOS: 2 Leaha Cuervo 11/22/20211:59 PM

## 2020-11-07 NOTE — Progress Notes (Addendum)
PROGRESS NOTE    Tina Gregory   AYT:[id number]  DOB: November 21, 1961  PCP: Pcp, No    DOA: 11/05/2020 LOS: 2   Brief Narrative   Tina Gregory is a 59 y.o. female with medical history significant of diabetes, hypertension, diabetic neuropathy, history of CVA, hyperlipidemia among other things who was brought in to the ED on 11/05/20 by family secondary to generalized weakness and a fall while trying to get in a wheelchair in [place] parking lot.      Evaluation in the ED revealed leukocytosis, low grade temp 99.5 F, tachycardia, tachypnea.  Creatinine 2.96.  Chest xray with bibasilar opacities concerning for pneumonia.  Left knee xray unremarkable, showed only soft tissue swelling.    Admitted for further management of community-acquired pneumonia and AKI.     Assessment & Plan   Principal Problem:   CAP (community acquired pneumonia) Active Problems:   AKI (acute kidney injury) ([name])   Diabetes ([name])   Essential hypertension   Hyperlipemia   Severe sepsis due to Community-acquired pneumonia - POA.  Met sepsis criteria with leukocytosis, tachycardia, tachypnea and evidence of PNA on imaging.  Organ dysfunction as evidenced by AKI.  Treated per sepsis protocol on admission. Leukocytosis is improving but not resolved. --Continue Rocephin and Zithromax --Antitussives PRN --likely transition to PO antibiotics tomorrow  AKI - POA with Cr 2.96.  Baseline unknown but likely has some CKD secondary to HTN and diabetes. Renal function worsening.  Cr now up to 3.76.   --Renal ultrasound ordered --Check urine protein:creatinine ratio - pending --Nephrology consult --Off IV fluids for now as pt eating & drinking, Cr did not improve with fluids and she has some LE edema --Renally dose meds --Hold Lasix, losartan and other nephrotoxins.   --D/c Crestor for now, current CrCl is 5.  Acute metabolic encephalopathy - pt has been lethargic past 2 days.  Given body habitus, highly suspect sleep  apnea and/or obesity hypoventilation syndrome. --Check ABG and further eval based on results  PM ADDENDUM - persistently lethargic.  ABG without hypercarbia.  CT head neg for acute findings.  Ammonia level normal.  Does have worsening renal function ?uremia.  MRI brain, EEG pending.  VBG tomorrow AM.    Transaminitis - present on admission.  Unclear etiology.  RUQ ultrasound to evaluate. Follow CMP  Likely OSA - pt snoring when asleep, and with body habitus she likely has sleep apnea.   --Continuous pulse ox overnight --Recommend outpatient sleep study  Type 2 diabetes with neuropathy - Home regimen is Levemir 35 units BID, Novolog 20-25 units TID WC and Trulicity. --Continue Lyrica --sliding scale Novolog --reduce Levemir to 30 units BID --reduce scheduled Novolog to 5 units TID WC  --Monitor CBG's, hypoglycemia protocol  Essential hypertension - Takes Coreg, clonidine, Lasix and losartan.  Hold losartan and Lasix due to AKI.  Continue Coreg & clonidine.   By charted vitals, BP appears very labile.    Hx of CVA - continue ASA and Plavix.  Stable without acute neurologic changes.  Depression / Anxiety - continue Elavil  Hypothyroidism - continue levothyroxine  ?Gout - continue allopurinol  Obesity: Body mass index is 40.1 kg/m.  Complicates overall care and prognosis.    DVT prophylaxis: enoxaparin (LOVENOX) injection 40 mg Start: 11/06/20 1000   Diet:  Diet Orders (From admission, onward)    Start     Ordered   11/05/20 2312  Diet heart healthy/carb modified Room service appropriate? Yes; Fluid consistency: Thin  Diet effective  now       Question Answer Comment  Diet-HS Snack? Nothing   Room service appropriate? Yes   Fluid consistency: Thin      11/05/20 2311            Code Status: Full Code    Subjective 11/07/20    Patient seen sleeping in bed after working with OT.  She wakes up more easily today.  Says feeling okay.  Denies fever/chills or shortness of  breath.  Knee feels better.   Disposition Plan & Communication   Status is: Inpatient  Remains inpatient appropriate because:IV treatments appropriate due to intensity of illness or inability to take PO   Dispo: The patient is from: Home              Anticipated d/c is to: Home              Anticipated d/c date is: 1-2 days              Patient currently is not medically stable to d/c.   Family Communication: none at bedside will attempt to call this afternoon.   Consults, Procedures, Significant Events   Consultants:   None  Procedures:   None  Antimicrobials:  Anti-infectives (From admission, onward)   Start     Dose/Rate Route Frequency Ordered Stop   11/06/20 1000  cefTRIAXone (ROCEPHIN) 2 g in sodium chloride 0.9 % 100 mL IVPB        2 g 200 mL/hr over 30 Minutes Intravenous Every 24 hours 11/05/20 2311 11/11/20 0959   11/05/20 2330  azithromycin (ZITHROMAX) 500 mg in sodium chloride 0.9 % 250 mL IVPB        500 mg 250 mL/hr over 60 Minutes Intravenous Every 24 hours 11/05/20 2311 11/10/20 2329   11/05/20 1900  cefTRIAXone (ROCEPHIN) 1 g in sodium chloride 0.9 % 100 mL IVPB        1 g 200 mL/hr over 30 Minutes Intravenous  Once 11/05/20 1852 11/05/20 2112   11/05/20 0000  azithromycin (ZITHROMAX Z-PAK) 250 MG tablet           11/05/20 2047          Objective   Vitals:   11/06/20 2032 11/06/20 2316 11/07/20 0312 11/07/20 0818  BP: (!) 154/77 135/70 (!) 142/75 98/68  Pulse: 89 91 85 81  Resp: $Remo'18 18 17 20  'CLxUl$ Temp: 98.6 F (37 C) 98.2 F (36.8 C) 98.4 F (36.9 C) 98.7 F (37.1 C)  TempSrc: Axillary Oral Oral Oral  SpO2: 96% 98% 99% 94%  Weight:      Height:       No intake or output data in the 24 hours ending 11/07/20 1225 Filed Weights   11/05/20 1423  Weight: 109.3 kg    Physical Exam:  General exam: sleeping, awakes easily and stays awake little better today, no distress, obese Respiratory system: R>L basilar crackles, decreased breath  sounds do to body habitus, no wheezes, normal respiratory effort. Cardiovascular system: normal S1/S2, RRR, trace b/l LE edema stable Gastrointestinal system: soft, NT, ND Central nervous system: no gross focal neurologic deficits, normal speech  Labs   Data Reviewed: I have personally reviewed following labs and imaging studies  CBC: Recent Labs  Lab 11/05/20 1402 11/06/20 0046 11/07/20 0933  WBC 17.8* 16.8* 12.2*  NEUTROABS  --  12.0* 7.9*  HGB 11.5* 9.8* 9.5*  HCT 35.4* 30.8* 29.8*  MCV 81.8 81.7 81.9  PLT 196 174 183  Basic Metabolic Panel: Recent Labs  Lab 11/05/20 1402 11/06/20 0046 11/07/20 0933  NA 134* 135 136  K 3.8 3.7 4.0  CL 102 107 105  CO2 20* 18* 19*  GLUCOSE 151* 221* 94  BUN 44* 44* 49*  CREATININE 2.96* 2.96* 3.76*  CALCIUM 9.0 8.4* 8.8*   GFR: Estimated Creatinine Clearance: 19.8 mL/min (A) (by C-G formula based on SCr of 3.76 mg/dL (H)). Liver Function Tests: Recent Labs  Lab 11/06/20 0046 11/07/20 0933  AST 64* 107*  ALT 53* 62*  ALKPHOS 132* 193*  BILITOT 1.1 1.3*  PROT 7.0 6.8  ALBUMIN 2.3* 2.0*   No results for input(s): LIPASE, AMYLASE in the last 168 hours. No results for input(s): AMMONIA in the last 168 hours. Coagulation Profile: No results for input(s): INR, PROTIME in the last 168 hours. Cardiac Enzymes: No results for input(s): CKTOTAL, CKMB, CKMBINDEX, TROPONINI in the last 168 hours. BNP (last 3 results) No results for input(s): PROBNP in the last 8760 hours. HbA1C: Recent Labs    11/06/20 0046  HGBA1C 10.8*   CBG: Recent Labs  Lab 11/06/20 1134 11/06/20 1255 11/06/20 1631 11/06/20 2018 11/07/20 0819  GLUCAP 332* 349* 295* 187* 104*   Lipid Profile: No results for input(s): CHOL, HDL, LDLCALC, TRIG, CHOLHDL, LDLDIRECT in the last 72 hours. Thyroid Function Tests: No results for input(s): TSH, T4TOTAL, FREET4, T3FREE, THYROIDAB in the last 72 hours. Anemia Panel: No results for input(s): VITAMINB12,  FOLATE, FERRITIN, TIBC, IRON, RETICCTPCT in the last 72 hours. Sepsis Labs: No results for input(s): PROCALCITON, LATICACIDVEN in the last 168 hours.  Recent Results (from the past 240 hour(s))  Resp Panel by RT-PCR (Flu A&B, Covid) Nasopharyngeal Swab     Status: None   Collection Time: 11/05/20  9:36 PM   Specimen: Nasopharyngeal Swab; Nasopharyngeal(NP) swabs in vial transport medium  Result Value Ref Range Status   SARS Coronavirus 2 by RT PCR NEGATIVE NEGATIVE Final    Comment: (NOTE) SARS-CoV-2 target nucleic acids are NOT DETECTED.  The SARS-CoV-2 RNA is generally detectable in upper respiratory specimens during the acute phase of infection. The lowest concentration of SARS-CoV-2 viral copies this assay can detect is 138 copies/mL. A negative result does not preclude SARS-Cov-2 infection and should not be used as the sole basis for treatment or other patient management decisions. A negative result may occur with  improper specimen collection/handling, submission of specimen other than nasopharyngeal swab, presence of viral mutation(s) within the areas targeted by this assay, and inadequate number of viral copies(<138 copies/mL). A negative result must be combined with clinical observations, patient history, and epidemiological information. The expected result is Negative.  Fact Sheet for Patients:  EntrepreneurPulse.com.au  Fact Sheet for Healthcare Providers:  IncredibleEmployment.be  This test is no t yet approved or cleared by the [place] FDA and  has been authorized for detection and/or diagnosis of SARS-CoV-2 by FDA under an Emergency Use Authorization (EUA). This EUA will remain  in effect (meaning this test can be used) for the duration of the COVID-19 declaration under Section 564(b)(1) of the Act, 21 U.S.C.section 360bbb-3(b)(1), unless the authorization is terminated  or revoked sooner.       Influenza A by PCR  NEGATIVE NEGATIVE Final   Influenza B by PCR NEGATIVE NEGATIVE Final    Comment: (NOTE) The Xpert Xpress SARS-CoV-2/FLU/RSV plus assay is intended as an aid in the diagnosis of influenza from Nasopharyngeal swab specimens and should not be used as a sole basis for  treatment. Nasal washings and aspirates are unacceptable for Xpert Xpress SARS-CoV-2/FLU/RSV testing.  Fact Sheet for Patients: EntrepreneurPulse.com.au  Fact Sheet for Healthcare Providers: IncredibleEmployment.be  This test is not yet approved or cleared by the [place] FDA and has been authorized for detection and/or diagnosis of SARS-CoV-2 by FDA under an Emergency Use Authorization (EUA). This EUA will remain in effect (meaning this test can be used) for the duration of the COVID-19 declaration under Section 564(b)(1) of the Act, 21 U.S.C. section 360bbb-3(b)(1), unless the authorization is terminated or revoked.  Performed at [facility], [address] [place], [place] [address]   Culture, blood (routine x 2) Call MD if unable to obtain prior to antibiotics being given     Status: None (Preliminary result)   Collection Time: 11/06/20 12:46 AM   Specimen: BLOOD  Result Value Ref Range Status   Specimen Description BLOOD LEFT ANTECUBITAL  Final   Special Requests   Final    BOTTLES DRAWN AEROBIC AND ANAEROBIC Blood Culture adequate volume   Culture   Final    NO GROWTH 1 DAY Performed at [facility], [address], [place], [place] [address]    Report Status PENDING  Incomplete  Culture, blood (routine x 2) Call MD if unable to obtain prior to antibiotics being given     Status: None (Preliminary result)   Collection Time: 11/06/20 12:46 AM   Specimen: BLOOD  Result Value Ref Range Status   Specimen Description BLOOD BLOOD LEFT FOREARM  Final   Special Requests   Final    BOTTLES DRAWN AEROBIC AND ANAEROBIC Blood Culture adequate volume    Culture   Final    NO GROWTH 1 DAY Performed at [facility], [address] [place],  [address]    Report Status PENDING  Incomplete      Imaging Studies   DG Chest 2 View  Result Date: 11/05/2020 CLINICAL DATA:  Weakness, cough, fall EXAM: CHEST - 2 VIEW COMPARISON:  None. FINDINGS: Heart size appears mildly enlarged. Atherosclerotic calcification of the aortic knob. Streaky right basilar opacities. No pleural effusion. No pneumothorax. IMPRESSION: 1. Streaky right basilar opacities, atelectasis versus infiltrate. 2. Mild cardiomegaly. Electronically Signed   By: Davina Poke D.O.   On: 11/05/2020 17:52   CT HEAD WO CONTRAST  Result Date: 11/05/2020 CLINICAL DATA:  Head trauma, coagulopathy EXAM: CT HEAD WITHOUT CONTRAST TECHNIQUE: Contiguous axial images were obtained from the base of the skull through the vertex without intravenous contrast. COMPARISON:  None. FINDINGS: Brain: Scattered moderate white matter microvascular ischemic changes throughout both cerebral hemispheres. No acute intracranial hemorrhage, new infarction, mass lesion, midline shift, herniation, hydrocephalus, or extra-axial fluid collection. No focal mass effect or edema. Cisterns are patent. No cerebellar abnormality. Vascular: No hyperdense vessel or unexpected calcification. Skull: Normal. Negative for fracture or focal lesion. Sinuses/Orbits: No acute finding. Other: None. IMPRESSION: Chronic white matter microvascular ischemic changes. No acute intracranial abnormality by noncontrast CT. Electronically Signed   By: Jerilynn Mages.  Shick M.D.   On: 11/05/2020 15:00   DG Knee Complete 4 Views Left  Result Date: 11/05/2020 CLINICAL DATA:  Left knee pain, EXAM: LEFT KNEE - COMPLETE 4+ VIEW COMPARISON:  None. FINDINGS: No evidence of fracture, dislocation, or joint effusion. No evidence of arthropathy or other focal bone abnormality. There is diffuse soft tissue swelling. Mild medial compartment osteoarthritis  is noted. IMPRESSION: No acute osseous abnormality. Diffuse soft tissue swelling. Electronically Signed   By: Kerby Moors  Avutu M.D.   On: 11/05/2020 17:54     Medications   Scheduled Meds: . allopurinol  300 mg Oral Daily  . amitriptyline  50 mg Oral QHS  . aspirin EC  81 mg Oral Daily  . carvedilol  25 mg Oral BID  . cloNIDine  0.2 mg Oral BID  . clopidogrel  75 mg Oral Daily  . enoxaparin (LOVENOX) injection  40 mg Subcutaneous Q24H  . insulin aspart  0-15 Units Subcutaneous TID WC  . insulin aspart  0-5 Units Subcutaneous QHS  . insulin aspart  5 Units Subcutaneous TID WC  . insulin detemir  30 Units Subcutaneous BID  . levothyroxine  25 mcg Oral Daily  . pregabalin  100 mg Oral TID  . rosuvastatin  20 mg Oral QHS   Continuous Infusions: . azithromycin Stopped (11/07/20 0117)  . cefTRIAXone (ROCEPHIN)  IV 2 g (11/07/20 0956)       LOS: 2 days    Time spent: 25 minutes with > 50% spent in coordination of care and direct patient contact.    Ezekiel Slocumb, DO [facility]  11/07/2020, 12:25 PM    If 7PM-7AM, please contact night-coverage. How to contact the [facility] Attending or Consulting provider [place] or covering provider during after hours [place], for this patient?    1. Check the care team in [facility] and look for a) attending/consulting TR[facility] provider listed and b) the [facility] team listed 2. Log into [email or web address] and use [place]'s universal password to access. If you do not have the password, please contact the hospital operator. 3. Locate the [facility] provider you are looking for under [facility] and page to a number that you can be directly reached. 4. If you still have difficulty reaching the provider, please page the [facility] (Director on Call) for the Hospitalists listed on amion for assistance.

## 2020-11-07 NOTE — Progress Notes (Signed)
PHARMACIST - PHYSICIAN COMMUNICATION DR:   Denton Lank CONCERNING: Antibiotic IV to Oral Route Change Policy  RECOMMENDATION: This patient is receiving Azithromycin by the intravenous route.  Based on criteria approved by the Pharmacy and Therapeutics Committee, the antibiotic(s) is/are being converted to the equivalent oral dose form(s).   DESCRIPTION: These criteria include:  Patient being treated for a respiratory tract infection, urinary tract infection, cellulitis or clostridium difficile associated diarrhea if on metronidazole  The patient is not neutropenic and does not exhibit a GI malabsorption state  The patient is eating (either orally or via tube) and/or has been taking other orally administered medications for a least 24 hours  The patient is improving clinically and has a Tmax < 100.5  If you have questions about this conversion, please contact the Pharmacy Department   Albina Billet, PharmD, BCPS Clinical Pharmacist 11/07/2020 2:08 PM

## 2020-11-08 ENCOUNTER — Inpatient Hospital Stay: Payer: Medicare Other

## 2020-11-08 ENCOUNTER — Inpatient Hospital Stay
Admit: 2020-11-08 | Discharge: 2020-11-08 | Disposition: A | Discharge status: Home / Self Care | Payer: Medicare Other | Attending: Internal Medicine | Admitting: Internal Medicine

## 2020-11-08 DIAGNOSIS — I10 Essential (primary) hypertension: Secondary | ICD-10-CM | POA: Diagnosis not present

## 2020-11-08 DIAGNOSIS — R404 Transient alteration of awareness: Secondary | ICD-10-CM | POA: Diagnosis not present

## 2020-11-08 DIAGNOSIS — J189 Pneumonia, unspecified organism: Secondary | ICD-10-CM | POA: Diagnosis not present

## 2020-11-08 DIAGNOSIS — N179 Acute kidney failure, unspecified: Secondary | ICD-10-CM | POA: Diagnosis not present

## 2020-11-08 DIAGNOSIS — E1142 Type 2 diabetes mellitus with diabetic polyneuropathy: Secondary | ICD-10-CM | POA: Diagnosis not present

## 2020-11-08 LAB — BLOOD GAS, VENOUS
Acid-base deficit: 5.8 mmol/L — ABNORMAL HIGH (ref 0.0–2.0)
Bicarbonate: 20.2 mmol/L (ref 20.0–28.0)
O2 Saturation: 92.5 %
Patient temperature: 37
pCO2, Ven: 42 mmHg — ABNORMAL LOW (ref 44.0–60.0)
pH, Ven: 7.29 (ref 7.250–7.430)
pO2, Ven: 73 mmHg — ABNORMAL HIGH (ref 32.0–45.0)

## 2020-11-08 LAB — COMPREHENSIVE METABOLIC PANEL
ALT: 55 U/L — ABNORMAL HIGH (ref 0–44)
ALT: 64 U/L — ABNORMAL HIGH (ref 0–44)
AST: 93 U/L — ABNORMAL HIGH (ref 15–41)
AST: 126 U/L — ABNORMAL HIGH (ref 15–41)
Albumin: 2.1 g/dL — ABNORMAL LOW (ref 3.5–5.0)
Albumin: 1.9 g/dL — ABNORMAL LOW (ref 3.5–5.0)
Alkaline Phosphatase: 205 U/L — ABNORMAL HIGH (ref 38–126)
Alkaline Phosphatase: 260 U/L — ABNORMAL HIGH (ref 38–126)
Anion gap: 13 (ref 5–15)
Anion gap: 12 (ref 5–15)
BUN: 51 mg/dL — ABNORMAL HIGH (ref 6–20)
BUN: 49 mg/dL — ABNORMAL HIGH (ref 6–20)
CO2: 18 mmol/L — ABNORMAL LOW (ref 22–32)
CO2: 18 mmol/L — ABNORMAL LOW (ref 22–32)
Calcium: 8.8 mg/dL — ABNORMAL LOW (ref 8.9–10.3)
Calcium: 8.5 mg/dL — ABNORMAL LOW (ref 8.9–10.3)
Chloride: 106 mmol/L (ref 98–111)
Chloride: 104 mmol/L (ref 98–111)
Creatinine, Ser: 4.52 mg/dL — ABNORMAL HIGH (ref 0.44–1.00)
Creatinine, Ser: 4.77 mg/dL — ABNORMAL HIGH (ref 0.44–1.00)
GFR, Estimated: 11 mL/min — ABNORMAL LOW (ref >60)
GFR, Estimated: 10 mL/min — ABNORMAL LOW (ref >60)
Glucose, Bld: 92 mg/dL (ref 70–99)
Glucose, Bld: 268 mg/dL — ABNORMAL HIGH (ref 70–99)
Potassium: 4 mmol/L (ref 3.5–5.1)
Potassium: 4.1 mmol/L (ref 3.5–5.1)
Sodium: 137 mmol/L (ref 135–145)
Sodium: 134 mmol/L — ABNORMAL LOW (ref 135–145)
Total Bilirubin: 1.4 mg/dL — ABNORMAL HIGH (ref 0.3–1.2)
Total Bilirubin: 1.9 mg/dL — ABNORMAL HIGH (ref 0.3–1.2)
Total Protein: 7 g/dL (ref 6.5–8.1)
Total Protein: 7.2 g/dL (ref 6.5–8.1)

## 2020-11-08 LAB — CBC
HCT: 29.3 % — ABNORMAL LOW (ref 36.0–46.0)
Hemoglobin: 9.2 g/dL — ABNORMAL LOW (ref 12.0–15.0)
MCH: 25.8 pg — ABNORMAL LOW (ref 26.0–34.0)
MCHC: 31.4 g/dL (ref 30.0–36.0)
MCV: 82.3 fL (ref 80.0–100.0)
Platelets: 203 10*3/uL (ref 150–400)
RBC: 3.56 MIL/uL — ABNORMAL LOW (ref 3.87–5.11)
RDW: 18.7 % — ABNORMAL HIGH (ref 11.5–15.5)
WBC: 11.7 10*3/uL — ABNORMAL HIGH (ref 4.0–10.5)
nRBC: 0 % (ref 0.0–0.2)

## 2020-11-08 LAB — KAPPA/LAMBDA LIGHT CHAINS
Kappa free light chain: 214.7 mg/L — ABNORMAL HIGH (ref 3.3–19.4)
Kappa, lambda light chain ratio: 1.77 — ABNORMAL HIGH (ref 0.26–1.65)
Lambda free light chains: 121.2 mg/L — ABNORMAL HIGH (ref 5.7–26.3)

## 2020-11-08 LAB — THYROID PANEL WITH TSH
Free Thyroxine Index: 1.5 (ref 1.2–4.9)
T3 Uptake Ratio: 31 % (ref 24–39)
T4, Total: 4.9 ug/dL (ref 4.5–12.0)
TSH: 2.36 u[IU]/mL (ref 0.450–4.500)

## 2020-11-08 LAB — GLUCOSE, CAPILLARY
Glucose-Capillary: 100 mg/dL — ABNORMAL HIGH (ref 70–99)
Glucose-Capillary: 102 mg/dL — ABNORMAL HIGH (ref 70–99)
Glucose-Capillary: 120 mg/dL — ABNORMAL HIGH (ref 70–99)
Glucose-Capillary: 168 mg/dL — ABNORMAL HIGH (ref 70–99)
Glucose-Capillary: 191 mg/dL — ABNORMAL HIGH (ref 70–99)
Glucose-Capillary: 264 mg/dL — ABNORMAL HIGH (ref 70–99)
Glucose-Capillary: 51 mg/dL — ABNORMAL LOW (ref 70–99)
Glucose-Capillary: 84 mg/dL (ref 70–99)
Glucose-Capillary: 94 mg/dL (ref 70–99)
Glucose-Capillary: 96 mg/dL (ref 70–99)

## 2020-11-08 LAB — PROTEIN / CREATININE RATIO, URINE
Creatinine, Urine: 67 mg/dL
Protein Creatinine Ratio: 8.1 mg/mg{Cre} — ABNORMAL HIGH (ref 0.00–0.15)
Total Protein, Urine: 543 mg/dL

## 2020-11-08 LAB — RPR: RPR Ser Ql: NONREACTIVE

## 2020-11-08 LAB — GLUCOSE, RANDOM: Glucose, Bld: 100 mg/dL — ABNORMAL HIGH (ref 70–99)

## 2020-11-08 LAB — VITAMIN B12: Vitamin B-12: 3482 pg/mL — ABNORMAL HIGH (ref 180–914)

## 2020-11-08 MED ORDER — TRAMADOL HCL 50 MG PO TABS
25.0000 mg | ORAL_TABLET | Freq: Four times a day (QID) | ORAL | Status: DC | PRN
  Administered 2020-11-08 – 2020-11-12 (×5): 25 mg via ORAL
  Filled 2020-11-08 (×6): qty 1

## 2020-11-08 MED ORDER — GADOBUTROL 1 MMOL/ML IV SOLN
10.0000 mL | Freq: Once | INTRAVENOUS | Status: AC | PRN
  Administered 2020-11-08: 10 mL via INTRAVENOUS

## 2020-11-08 MED ORDER — DEXTROSE 50 % IV SOLN: 1.0000 | INTRAVENOUS | Status: DC | PRN

## 2020-11-08 MED ORDER — DEXTROSE 50 % IV SOLN
INTRAVENOUS | Status: AC
  Administered 2020-11-08: 50 mL via INTRAVENOUS
  Filled 2020-11-08: qty 50

## 2020-11-08 MED ORDER — SODIUM BICARBONATE 8.4 % IV SOLN
INTRAVENOUS | Status: DC
  Filled 2020-11-08 (×12): qty 50

## 2020-11-08 MED ORDER — NICOTINE 21 MG/24HR TD PT24
21.0000 mg | MEDICATED_PATCH | Freq: Every day | TRANSDERMAL | Status: DC
  Administered 2020-11-08 – 2020-11-15 (×8): 21 mg via TRANSDERMAL
  Filled 2020-11-08 (×8): qty 1

## 2020-11-08 MED ORDER — CHLORHEXIDINE GLUCONATE CLOTH 2 % EX PADS
6.0000 | MEDICATED_PAD | Freq: Every day | CUTANEOUS | Status: DC
  Administered 2020-11-08 – 2020-11-15 (×6): 6 via TOPICAL

## 2020-11-08 NOTE — Progress Notes (Signed)
Occupational Therapy Treatment Patient Details Name: Tina Gregory MRN: [id number] DOB: October 03, 1961 Today's Date: 11/08/2020    History of present illness Pt is 59 y/o F with PMH: DM, HTN, HLD, diabetic neuropathy, and CVA. Pt brought to [facility] ED on 11/05/20 by family secondary to generalized weakness and a fall while trying to get in a wheelchair in [place] parking lot. Pt admitted d/t community-acquired pneumonia and AKI.   OT comments  Pt seen for OT tx this date to f/u re: safety with ADLs/ADL mobility. OT faciliates pt participation in sup to sit with MIN A with HOB elevated. Pt demos improved sitting balance this date and general tolerance/alertness. OT facilitates pt participation in seated g/h task with CGA and SETUP. Pt requires ed re: safe scooting transfer technique and OT facilitates MOD A scoot transfer from bed to drop-arm recliner. Pt tolerates well. Pt left with CNA/RN presenting and OT informs them that pt still needs her call light and chair alarm activated. Both parties start to address their respective patient duties and agree to finish setting pt up. Overall, pt continues to benefit from skilled OT during acute stay to improve strength and tolerance as well as prevent atrophy and decreased independence. Continue to anticipate that pt would benefit from STR upon d/c to improve safety with self care, ADL transfers and fxl mobility.    Follow Up Recommendations  SNF    Equipment Recommendations  Other (comment) (TBD)    Recommendations for Other Services      Precautions / Restrictions Precautions Precautions: Fall Restrictions Weight Bearing Restrictions: No       Mobility Bed Mobility Overal bed mobility: Needs Assistance Bed Mobility: Supine to Sit;Sit to Supine     Supine to sit: Min assist;Mod assist;HOB elevated        Transfers Overall transfer level: Needs assistance   Transfers: Lateral/Scoot Transfers          Lateral/Scoot Transfers: Mod  assist General transfer comment: MOD A to scoot to her R from bed to reclining chair with MIN/MOD verbal tactile cues for safety, sequence, and attempting to elevate her bottom some to prevent breakdown.    Balance Overall balance assessment: Needs assistance Sitting-balance support: Feet supported;Single extremity supported Sitting balance-Leahy Scale: Fair Sitting balance - Comments: improved static sitting balance today, does require intermittent CGA and verbal cues to right herself, but overall improvement.       Standing balance comment: stand not attempted this date for safety                           ADL either performed or assessed with clinical judgement   ADL Overall ADL's : Needs assistance/impaired     Grooming: Wash/dry face;Set up;Supervision/safety;Sitting Grooming Details (indicate cue type and reason): sitting EOB with intermittent CGA and verbal cues to sustain static sitting balance                                     Vision Patient Visual Report: No change from baseline     Perception     Praxis      Cognition Arousal/Alertness: Awake/alert Behavior During Therapy: WFL for tasks assessed/performed Overall Cognitive Status: No family/caregiver present to determine baseline cognitive functioning  General Comments: pt more awake during session today, oriented to time, place, and some aspects of situation. She is primarily appropriate, but does have 2 short episodes of confuasion including one espisode where she hallucinates that she's seeing her friend "Darryl" and talking to him.        Exercises Other Exercises Other Exercises: OT faciliates pt participation in sup to sit with MIN A with HOB elevated. Pt demos improved sitting balance this date and general tolerance/alertness. OT facilitates pt participation in seated g/h task with CGA and SETUP.  Pt requires ed re: safe scooting  transfer technique and OT facilitates MOD A scoot transfer from bed to drop-arm recliner. Pt tolerates well. Pt left with CNA/RN presenting and OT informs them that pt still needs her call light and chair alarm activated. Both parties start to address their respective patient duties and agree to finish setting pt up.   Shoulder Instructions       General Comments BP checked to ensure not dropping, it's noted to be more elevated today versus yesterday and pt with decresed drowsiness noted.    Pertinent Vitals/ Pain       Pain Assessment: Faces Faces Pain Scale: Hurts a little bit Pain Location: bottom Pain Descriptors / Indicators: Tender Pain Intervention(s): Limited activity within patient's tolerance;Monitored during session  Home Living                                          Prior Functioning/Environment              Frequency  Min 1X/week        Progress Toward Goals  OT Goals(current goals can now be found in the care plan section)  Progress towards OT goals: Progressing toward goals  Acute Rehab OT Goals Patient Stated Goal: to go home OT Goal Formulation: With patient Time For Goal Achievement: 11/21/20 Potential to Achieve Goals: Fair  Plan Discharge plan remains appropriate    Co-evaluation                 AM-PAC OT "6 Clicks" Daily Activity     Outcome Measure   Help from another person eating meals?: None Help from another person taking care of personal grooming?: A Little Help from another person toileting, which includes using toliet, bedpan, or urinal?: A Lot Help from another person bathing (including washing, rinsing, drying)?: A Lot Help from another person to put on and taking off regular upper body clothing?: A Little Help from another person to put on and taking off regular lower body clothing?: A Lot 6 Click Score: 16    End of Session    OT Visit Diagnosis: Unsteadiness on feet (R26.81);Muscle weakness  (generalized) (M62.81)   Activity Tolerance Patient tolerated treatment well;Patient limited by fatigue   Patient Left in chair;with nursing/sitter in room (CNA and RN present to finish setting pt up)   Nurse Communication Mobility status;Other (comment) (notified to turn chair alarm on, scoot transfer, and to not leave pt up longer than ~1hr as she will likely fatigue quickly.)        Time: 1540-1618 OT Time Calculation (min): 38 min  Charges: OT General Charges $OT Visit: 1 Visit OT Treatments $Self Care/Home Management : 8-22 mins $Therapeutic Activity: 23-37 mins  Rejeana Brock, MS, OTR/L ascom [phone] 11/08/20, 5:18 PM

## 2020-11-08 NOTE — Progress Notes (Signed)
PT Cancellation Note  Patient Details Name: Tina Gregory MRN: [id number] DOB: 02-06-1961   Cancelled Treatment:    Reason Eval/Treat Not Completed:  (Chart reviewed for re-attempt at evaluation. Patient remains lethargic and unable to maintain alertness for participation with session.  Verbally mumbles responses at times (with max stimulation/encouragement), but does not open eyes and does not maintain alertness.  Will continue efforts as medically appropriate and available.)   Kirsten Spearing H. Manson Passey, PT, DPT, NCS 11/08/20, 11:26 AM [phone]

## 2020-11-08 NOTE — Progress Notes (Signed)
PROGRESS NOTE    Tina Gregory   PJK:[id number]  DOB: 07/02/1961  PCP: Pcp, No    DOA: 11/05/2020 LOS: 3   Brief Narrative   Tina Gregory is a 59 y.o. female with medical history significant of diabetes, hypertension, diabetic neuropathy, history of CVA, hyperlipidemia among other things who was brought in to the ED on 11/05/20 by family secondary to generalized weakness and a fall while trying to get in a wheelchair in [place] parking lot.      Evaluation in the ED revealed leukocytosis, low grade temp 99.5 F, tachycardia, tachypnea.  Creatinine 2.96.  Chest xray with bibasilar opacities concerning for pneumonia.  Left knee xray unremarkable, showed only soft tissue swelling.    Admitted for further management of community-acquired pneumonia and AKI.     Assessment & Plan   Principal Problem:   CAP (community acquired pneumonia) Active Problems:   AKI (acute kidney injury) ([place])   Diabetes ([place])   Essential hypertension   Hyperlipemia   Severe sepsis due to Community-acquired pneumonia - POA.  Met sepsis criteria with leukocytosis, tachycardia, tachypnea and evidence of PNA on imaging.  Organ dysfunction as evidenced by AKI.  Treated per sepsis protocol on admission. Leukocytosis is improving but not resolved. --Continue Rocephin and Zithromax --Antitussives PRN  Acute Urinary Retention - 2500cc retained.  Foley placed.  Monitor outputs.  Expect improvement in renal function.  Consider voiding trial in ~48 hours.  Avoid anticholinergics.  AKI - Worsening.  POA with Cr 2.96, now 4.57.   Found with urinary retention as above.  AKI due to obstructive uropathy.   Baseline unknown but likely has some CKD secondary to HTN and diabetes. Renal ultrasound shows no hydronephrosis, chronic medical renal disease, very distended bladder 2407 cc. --Nephrology consulted --Renally dose meds  --Hold Lasix, losartan and other nephrotoxins.    Acute metabolic encephalopathy - pt has been  lethargic past 2 days, slowly improving.  Suspect multifactorial due to infection and uremia with renal failure.  ABG without hypercarbia.  CT head negative.  Ammonia level normal.  Neurology consulted. MRI showed chronic microvascular ischemic changes, nothing acute.  Transaminitis - present on admission.  Unclear etiology, but ultrasound of RUQ showed "heterogeneous increased hepatic echogenicity consistent with fatty infiltration or hepatocellular disease. Hepatic contour is slightly irregular. Cirrhosis cannot be excluded." Follow CMP.  Outpatient follow up.    Likely OSA and/or Obesity Hypoventilation - pt snoring when asleep, and with body habitus she likely has sleep apnea.   --Continuous pulse ox overnight --Recommend outpatient sleep study  Type 2 diabetes with neuropathy - Home regimen is Levemir 35 units BID, Novolog 20-25 units TID WC and Trulicity. --Continue Lyrica --sliding scale Novolog --reduce Levemir to 30 units BID --reduce scheduled Novolog to 5 units TID WC  --Monitor CBG's, hypoglycemia protocol  Essential hypertension -  Has had soft BP's.   Takes Coreg, clonidine, Lasix and losartan.  Hold losartan and Lasix due to AKI.  Continue Coreg & clonidine.   By charted vitals, BP appears labile.    Hx of CVA - continue ASA and Plavix.  MRI brain negative for acute findings.  Depression / Anxiety - continue Elavil  Hypothyroidism - continue levothyroxine  ?Gout - continue allopurinol  Obesity: Body mass index is 40.1 kg/m.  Complicates overall care and prognosis.    DVT prophylaxis: enoxaparin (LOVENOX) injection 40 mg Start: 11/06/20 1000   Diet:  Diet Orders (From admission, onward)    Start  Ordered   11/08/20 0955  Diet Carb Modified Fluid consistency: Thin; Room service appropriate? Yes  Diet effective now       Question Answer Comment  Diet-HS Snack? Nothing   Calorie Level Medium 1600-2000   Fluid consistency: Thin   Room service appropriate? Yes       11/08/20 0955            Code Status: Full Code    Subjective 11/08/20    Patient seen at bedside this AM.  She is awake and able to answer all my questions and follow commands today.  She reports feeling okay.  Denies fever/chills, SOB, CP, N/V, pain or other acute complaints.     Disposition Plan & Communication   Status is: Inpatient  Remains inpatient appropriate because:IV treatments appropriate due to intensity of illness or inability to take PO.  Pt with encephalopathy, on IV antibiotics, has acute renal failure.   Dispo: The patient is from: Home              Anticipated d/c is to: Home              Anticipated d/c date is: 2-3 days              Patient currently is not medically stable to d/c.   Family Communication: spoke with patient's daughter by phone this afternoon regarding status, plan of care, all questions answered.     Consults, Procedures, Significant Events   Consultants:   Nephrology  Neurology  Procedures:   Foley placed 11/23 for acute urinary retention  Antimicrobials:  Anti-infectives (From admission, onward)   Start     Dose/Rate Route Frequency Ordered Stop   11/08/20 1800  azithromycin (ZITHROMAX) 500 mg in sodium chloride 0.9 % 250 mL IVPB        500 mg 250 mL/hr over 60 Minutes Intravenous Every 24 hours 11/07/20 2250 11/11/20 1759   11/07/20 2200  azithromycin (ZITHROMAX) tablet 500 mg  Status:  Discontinued        500 mg Oral Daily 11/07/20 1407 11/07/20 2249   11/06/20 1000  cefTRIAXone (ROCEPHIN) 2 g in sodium chloride 0.9 % 100 mL IVPB        2 g 200 mL/hr over 30 Minutes Intravenous Every 24 hours 11/05/20 2311 11/11/20 0959   11/05/20 2330  azithromycin (ZITHROMAX) 500 mg in sodium chloride 0.9 % 250 mL IVPB  Status:  Discontinued        500 mg 250 mL/hr over 60 Minutes Intravenous Every 24 hours 11/05/20 2311 11/07/20 1407   11/05/20 1900  cefTRIAXone (ROCEPHIN) 1 g in sodium chloride 0.9 % 100 mL IVPB        1  g 200 mL/hr over 30 Minutes Intravenous  Once 11/05/20 1852 11/05/20 2112   11/05/20 0000  azithromycin (ZITHROMAX Z-PAK) 250 MG tablet           11/05/20 2047          Objective   Vitals:   11/07/20 2000 11/07/20 2345 11/08/20 0300 11/08/20 0925  BP: 103/78 129/64 122/71 139/79  Pulse: 78 78 78 80  Resp: _0 Temp: 98.1 F (36.7 C) 98.4 F (36.9 C) 98.4 F (36.9 C) 98.9 F (37.2 C)  TempSrc: Oral Axillary    SpO2: 94% 95% 98% 95%  Weight:      Height:        Intake/Output Summary (Last 24 hours) at 11/08/2020 1033 Last data filed  at 11/08/2020 0920 Gross per 24 hour  Intake 950 ml  Output 2500 ml  Net -1550 ml   Filed Weights   11/05/20 1423  Weight: 109.3 kg    Physical Exam:  General exam: awake but sleepy, no distress, obese, stays awake for exam today and answers questions Respiratory system: diminished breath sounds but overall clear, no wheezes, normal respiratory effort. Cardiovascular system: normal S1/S2, RRR, trace b/l LE edema stable Gastrointestinal system: soft, NT, ND, +bowel sounds Central nervous system: follows commands, CN's intact, normal speech, motor is 5/5 symmetric throughout, no focal deficits. Genitourinary: Foley in place  Labs   Data Reviewed: I have personally reviewed following labs and imaging studies  CBC: Recent Labs  Lab 11/05/20 1402 11/06/20 0046 11/07/20 0933 11/08/20 0554  WBC 17.8* 16.8* 12.2* 11.7*  NEUTROABS  --  12.0* 7.9*  --   HGB 11.5* 9.8* 9.5* 9.2*  HCT 35.4* 30.8* 29.8* 29.3*  MCV 81.8 81.7 81.9 82.3  PLT 196 174 183 488   Basic Metabolic Panel: Recent Labs  Lab 11/05/20 1402 11/06/20 0046 11/07/20 0933 11/08/20 0109 11/08/20 0554  NA 134* 135 136  --  137  K 3.8 3.7 4.0  --  4.0  CL 102 107 105  --  106  CO2 20* 18* 19*  --  18*  GLUCOSE 151* 221* 94 100* 92  BUN 44* 44* 49*  --  51*  CREATININE 2.96* 2.96* 3.76*  --  4.52*  CALCIUM 9.0 8.4* 8.8*  --  8.8*   GFR: Estimated  Creatinine Clearance: 16.5 mL/min (A) (by C-G formula based on SCr of 4.52 mg/dL (H)). Liver Function Tests: Recent Labs  Lab 11/06/20 0046 11/07/20 0933 11/08/20 0554  AST 64* 107* 93*  ALT 53* 62* 55*  ALKPHOS 132* 193* 205*  BILITOT 1.1 1.3* 1.4*  PROT 7.0 6.8 7.0  ALBUMIN 2.3* 2.0* 2.1*   No results for input(s): LIPASE, AMYLASE in the last 168 hours. Recent Labs  Lab 11/07/20 1425  AMMONIA 27   Coagulation Profile: No results for input(s): INR, PROTIME in the last 168 hours. Cardiac Enzymes: No results for input(s): CKTOTAL, CKMB, CKMBINDEX, TROPONINI in the last 168 hours. BNP (last 3 results) No results for input(s): PROBNP in the last 8760 hours. HbA1C: Recent Labs    11/06/20 0046  HGBA1C 10.8*   CBG: Recent Labs  Lab 11/08/20 0133 11/08/20 0249 11/08/20 0358 11/08/20 0534 11/08/20 0909  GLUCAP 84 191* 96 120* 102*   Lipid Profile: No results for input(s): CHOL, HDL, LDLCALC, TRIG, CHOLHDL, LDLDIRECT in the last 72 hours. Thyroid Function Tests: Recent Labs    11/07/20 1425  TSH 2.360  T4TOTAL 4.9   Anemia Panel: Recent Labs    11/07/20 1425  FERRITIN 53  TIBC 242*  IRON 14*   Sepsis Labs: No results for input(s): PROCALCITON, LATICACIDVEN in the last 168 hours.  Recent Results (from the past 240 hour(s))  Resp Panel by RT-PCR (Flu A&B, Covid) Nasopharyngeal Swab     Status: None   Collection Time: 11/05/20  9:36 PM   Specimen: Nasopharyngeal Swab; Nasopharyngeal(NP) swabs in vial transport medium  Result Value Ref Range Status   SARS Coronavirus 2 by RT PCR NEGATIVE NEGATIVE Final    Comment: (NOTE) SARS-CoV-2 target nucleic acids are NOT DETECTED.  The SARS-CoV-2 RNA is generally detectable in upper respiratory specimens during the acute phase of infection. The lowest concentration of SARS-CoV-2 viral copies this assay can detect is 138 copies/mL. A  negative result does not preclude SARS-Cov-2 infection and should not be used as  the sole basis for treatment or other patient management decisions. A negative result may occur with  improper specimen collection/handling, submission of specimen other than nasopharyngeal swab, presence of viral mutation(s) within the areas targeted by this assay, and inadequate number of viral copies(<138 copies/mL). A negative result must be combined with clinical observations, patient history, and epidemiological information. The expected result is Negative.  Fact Sheet for Patients:  EntrepreneurPulse.com.au  Fact Sheet for Healthcare Providers:  IncredibleEmployment.be  This test is no t yet approved or cleared by the [place] FDA and  has been authorized for detection and/or diagnosis of SARS-CoV-2 by FDA under an Emergency Use Authorization (EUA). This EUA will remain  in effect (meaning this test can be used) for the duration of the COVID-19 declaration under Section 564(b)(1) of the Act, 21 U.S.C.section 360bbb-3(b)(1), unless the authorization is terminated  or revoked sooner.       Influenza A by PCR NEGATIVE NEGATIVE Final   Influenza B by PCR NEGATIVE NEGATIVE Final    Comment: (NOTE) The Xpert Xpress SARS-CoV-2/FLU/RSV plus assay is intended as an aid in the diagnosis of influenza from Nasopharyngeal swab specimens and should not be used as a sole basis for treatment. Nasal washings and aspirates are unacceptable for Xpert Xpress SARS-CoV-2/FLU/RSV testing.  Fact Sheet for Patients: EntrepreneurPulse.com.au  Fact Sheet for Healthcare Providers: IncredibleEmployment.be  This test is not yet approved or cleared by the [place] FDA and has been authorized for detection and/or diagnosis of SARS-CoV-2 by FDA under an Emergency Use Authorization (EUA). This EUA will remain in effect (meaning this test can be used) for the duration of the COVID-19 declaration under Section 564(b)(1) of  the Act, 21 U.S.C. section 360bbb-3(b)(1), unless the authorization is terminated or revoked.  Performed at [facility], [address] [place], [place] [address]   Culture, blood (routine x 2) Call MD if unable to obtain prior to antibiotics being given     Status: None (Preliminary result)   Collection Time: 11/06/20 12:46 AM   Specimen: BLOOD  Result Value Ref Range Status   Specimen Description BLOOD LEFT ANTECUBITAL  Final   Special Requests   Final    BOTTLES DRAWN AEROBIC AND ANAEROBIC Blood Culture adequate volume   Culture   Final    NO GROWTH 2 DAYS Performed at [facility], [address], [place], [place] [address]    Report Status PENDING  Incomplete  Culture, blood (routine x 2) Call MD if unable to obtain prior to antibiotics being given     Status: None (Preliminary result)   Collection Time: 11/06/20 12:46 AM   Specimen: BLOOD  Result Value Ref Range Status   Specimen Description BLOOD BLOOD LEFT FOREARM  Final   Special Requests   Final    BOTTLES DRAWN AEROBIC AND ANAEROBIC Blood Culture adequate volume   Culture   Final    NO GROWTH 2 DAYS Performed at [facility], [address] [place], [place] [address]    Report Status PENDING  Incomplete      Imaging Studies   CT HEAD WO CONTRAST  Result Date: 11/07/2020 CLINICAL DATA:  Mental status change, unknown cause. Additional provided: Generalized weakness, fall. EXAM: CT HEAD WITHOUT CONTRAST TECHNIQUE: Contiguous axial images were obtained from the base of the skull through the vertex without intravenous contrast. COMPARISON:  Head CT 11/05/2020. FINDINGS: Brain: Cerebral volume is  normal for age. Stable moderate ill-defined hypoattenuation within the cerebral white matter is nonspecific, but compatible chronic small vessel ischemic disease. There is no acute intracranial hemorrhage. No demarcated cortical infarct. No extra-axial fluid collection. No evidence of intracranial  mass. No midline shift. Vascular: No hyperdense vessel.  Atherosclerotic calcifications. Skull: Normal. Negative for fracture or focal lesion. Sinuses/Orbits: Visualized orbits show no acute finding. Mild ethmoid sinus mucosal thickening. IMPRESSION: No evidence of acute intracranial abnormality. Moderate cerebral white matter chronic small vessel ischemic disease, stable as compared to the head CT of 11/05/2020. Mild ethmoid sinus mucosal thickening. Electronically Signed   By: Kellie Simmering DO   On: 11/07/2020 14:17   US RENAL  Result Date: 11/08/2020 CLINICAL DATA:  Acute renal injury. EXAM: RENAL / URINARY TRACT ULTRASOUND COMPLETE COMPARISON:  No prior. FINDINGS: Right Kidney: Renal measurements: 10.5 x 5.1 x 5.8 cm = volume: 164 mL. Increased echogenicity. No mass or hydronephrosis. Left Kidney: Renal measurements: 11.0 x 5.6 x 5.5 cm = volume: 175 mL. Increased echogenicity. No mass or hydronephrosis. Bladder: Bladder is very distended at 2407 cc. The patient has a PureWick in place. Other: None. IMPRESSION: 1. Increased echogenicity both kidneys consistent with chronic medical renal disease. No hydronephrosis. 2. The bladder is very distended at 2407 CC. Electronically Signed   By: Marcello Moores  Register   On: 11/08/2020 09:02     Medications   Scheduled Meds:  allopurinol  200 mg Oral Daily   amitriptyline  50 mg Oral QHS   aspirin EC  81 mg Oral Daily   carvedilol  25 mg Oral BID   Chlorhexidine Gluconate Cloth  6 each Topical Daily   cloNIDine  0.2 mg Oral BID   clopidogrel  75 mg Oral Daily   enoxaparin (LOVENOX) injection  40 mg Subcutaneous Q24H   feeding supplement  237 mL Oral TID with meals   insulin aspart  0-15 Units Subcutaneous TID WC   insulin aspart  0-5 Units Subcutaneous QHS   insulin aspart  5 Units Subcutaneous TID WC   insulin detemir  30 Units Subcutaneous BID   levothyroxine  25 mcg Oral Daily   multivitamin with minerals  1 tablet Oral Daily    Continuous Infusions:  sodium chloride 50 mL/hr at 11/07/20 2159   azithromycin     cefTRIAXone (ROCEPHIN)  IV 2 g (11/08/20 0957)   dextrose 75 mL/hr at 11/08/20 0856       LOS: 3 days    Time spent: 34mnutes with > 50% spent in coordination of care and direct patient contact.    KEzekiel Slocumb DO [facility]  11/08/2020, 10:33 AM    If 7PM-7AM, please contact night-coverage. How to contact the T[facility] Surgery [facility] LLCAttending or Consulting provider [place] provider during after hours [place] for this patient?    1. Check the care team in [facility] look for a) attending/consulting TR[facility] provider listed and b) the [facility]Per[facility] Centerteam listed 2. Log into [email or web address] and use [place]'s universal password to access. If you do not have the password, please contact the hospital operator. 3. Locate the TL[facility] Golf Surgical Centerprovider you are looking for under [facility] and page to a number that you can be directly reached. 4. If you still have difficulty reaching the provider, please page the [facility](Director on Call) for the Hospitalists listed on amion for assistance.

## 2020-11-08 NOTE — Progress Notes (Signed)
Walked into patients room to do my assessment, patient is very lethargic and hard to arouse compared to the last 2 nights I've had her. She will respond to touch and voice but will only open eyes for a couple seconds. She did follow commands but had to keep trying to wake her up throughout assessment, and asking her to perform things multiple times. She was able to squeeze my hands, raise up her arms, and performing dorsiflexion/plantar flexion but is very weak... basically a complete 180 of the previous 2 nights. Her speech is also intermittently clear/easy to understand but then she starts to slurred and is incomprehensible. I paged NP on call to inform her of status change, even though rapid was called earlier. Daughter also in the room concerned about her mother.Will continue to monitor closely

## 2020-11-08 NOTE — Progress Notes (Signed)
Received a call from [place], and they stated that patient had 2500 ml urine in her bladder. Messaged MD, new order for Foley catheter. Patient stated that she felt like she had to urinate but could not.

## 2020-11-08 NOTE — Consult Note (Signed)
Reason for Consult: AMS  Requesting Physician: Dr. Denton Lank   CC: AMS   HPI: Tina Gregory is an 59 y.o. female with medical history significant ofdiabetes, hypertension, diabetic neuropathy, history of CVA, hyperlipidemia brought in to the ED on 11/05/20 by family secondary to generalized weakness and a fall while trying to get in a wheelchair in [place] parking lot.  Pt was found to have elevated WBC, AKI in the setting of PNA.       Past Medical History:  Diagnosis Date  . Diabetes mellitus without complication (HCC)   . Hyperlipidemia   . Hypertension   . Neuropathy   . Stroke [facility])     History reviewed. No pertinent surgical history.  History reviewed. No pertinent family history.  Social History:  reports that she has been smoking cigarettes. She has been smoking about 0.50 packs per day. She has never used smokeless tobacco. She reports that she does not drink alcohol and does not use drugs.  Allergies  Allergen Reactions  . Lisinopril     Headache, shaking  . Shellfish Allergy Hives    Medications: I have reviewed the patient's current medications.  ROS: History obtained from the patient  General ROS: negative for - chills, fatigue, fever, night sweats, weight gain or weight loss Psychological ROS: negative for - behavioral disorder, hallucinations, memory difficulties, mood swings or suicidal ideation Ophthalmic ROS: negative for - blurry vision, double vision, eye pain or loss of vision ENT ROS: negative for - epistaxis, nasal discharge, oral lesions, sore throat, tinnitus or vertigo Allergy and Immunology ROS: negative for - hives or itchy/watery eyes Hematological and Lymphatic ROS: negative for - bleeding problems, bruising or swollen lymph nodes Endocrine ROS: negative for - galactorrhea, hair pattern changes, polydipsia/polyuria or temperature intolerance Respiratory ROS: negative for - cough, hemoptysis, shortness of breath or wheezing Cardiovascular  ROS: negative for - chest pain, dyspnea on exertion, edema or irregular heartbeat Gastrointestinal ROS: negative for - abdominal pain, diarrhea, hematemesis, nausea/vomiting or stool incontinence Genito-Urinary ROS: negative for - dysuria, hematuria, incontinence or urinary frequency/urgency Musculoskeletal ROS: negative for - joint swelling or muscular weakness Neurological ROS: as noted in HPI Dermatological ROS: negative for rash and skin lesion changes  Physical Examination: Blood pressure 139/79, pulse 80, temperature 98.9 F (37.2 C), resp. rate 20, height 5\' 5"  (1.651 m), weight 109.3 kg, SpO2 95 %.  Neurological Examination   Mental Status: Alert, oriented to name and location  Cranial Nerves: II: Discs flat bilaterally; Visual fields grossly normal, pupils equal, round, reactive to light and accommodation III,IV, VI: ptosis not present, extra-ocular motions intact bilaterally V,VII: smile symmetric, facial light touch sensation normal bilaterally VIII: hearing normal bilaterally XI: bilateral shoulder shrug XII: midline tongue extension Motor: No focal weakness but pt has generalized weakness in her upper and lower extremiteis.  Sensory: Pinprick and light touch intact throughout, bilaterally Deep Tendon Reflexes: 1+ and symmetric throughout     Laboratory Studies:   Basic Metabolic Panel: Recent Labs  Lab 11/05/20 1402 11/05/20 1402 11/06/20 0046 11/07/20 0933 11/08/20 0109 11/08/20 0554  NA 134*  --  135 136  --  137  K 3.8  --  3.7 4.0  --  4.0  CL 102  --  107 105  --  106  CO2 20*  --  18* 19*  --  18*  GLUCOSE 151*  --  221* 94 100* 92  BUN 44*  --  44* 49*  --  51*  CREATININE 2.96*  --  2.96* 3.76*  --  4.52*  CALCIUM 9.0   < > 8.4* 8.8*  --  8.8*   < > = values in this interval not displayed.    Liver Function Tests: Recent Labs  Lab 11/06/20 0046 11/07/20 0933 11/08/20 0554  AST 64* 107* 93*  ALT 53* 62* 55*  ALKPHOS 132* 193* 205*   BILITOT 1.1 1.3* 1.4*  PROT 7.0 6.8 7.0  ALBUMIN 2.3* 2.0* 2.1*   No results for input(s): LIPASE, AMYLASE in the last 168 hours. Recent Labs  Lab 11/07/20 1425  AMMONIA 27    CBC: Recent Labs  Lab 11/05/20 1402 11/06/20 0046 11/07/20 0933 11/08/20 0554  WBC 17.8* 16.8* 12.2* 11.7*  NEUTROABS  --  12.0* 7.9*  --   HGB 11.5* 9.8* 9.5* 9.2*  HCT 35.4* 30.8* 29.8* 29.3*  MCV 81.8 81.7 81.9 82.3  PLT 196 174 183 203    Cardiac Enzymes: No results for input(s): CKTOTAL, CKMB, CKMBINDEX, TROPONINI in the last 168 hours.  BNP: Invalid input(s): POCBNP  CBG: Recent Labs  Lab 11/08/20 0133 11/08/20 0249 11/08/20 0358 11/08/20 0534 11/08/20 0909  GLUCAP 84 191* 96 120* 102*    Microbiology: Results for orders placed or performed during the hospital encounter of 11/05/20  Resp Panel by RT-PCR (Flu A&B, Covid) Nasopharyngeal Swab     Status: None   Collection Time: 11/05/20  9:36 PM   Specimen: Nasopharyngeal Swab; Nasopharyngeal(NP) swabs in vial transport medium  Result Value Ref Range Status   SARS Coronavirus 2 by RT PCR NEGATIVE NEGATIVE Final    Comment: (NOTE) SARS-CoV-2 target nucleic acids are NOT DETECTED.  The SARS-CoV-2 RNA is generally detectable in upper respiratory specimens during the acute phase of infection. The lowest concentration of SARS-CoV-2 viral copies this assay can detect is 138 copies/mL. A negative result does not preclude SARS-Cov-2 infection and should not be used as the sole basis for treatment or other patient management decisions. A negative result may occur with  improper specimen collection/handling, submission of specimen other than nasopharyngeal swab, presence of viral mutation(s) within the areas targeted by this assay, and inadequate number of viral copies(<138 copies/mL). A negative result must be combined with clinical observations, patient history, and epidemiological information. The expected result is  Negative.  Fact Sheet for Patients:  BloggerCourse.com  Fact Sheet for Healthcare Providers:  SeriousBroker.it  This test is no t yet approved or cleared by the [place] FDA and  has been authorized for detection and/or diagnosis of SARS-CoV-2 by FDA under an Emergency Use Authorization (EUA). This EUA will remain  in effect (meaning this test can be used) for the duration of the COVID-19 declaration under Section 564(b)(1) of the Act, 21 U.S.C.section 360bbb-3(b)(1), unless the authorization is terminated  or revoked sooner.       Influenza A by PCR NEGATIVE NEGATIVE Final   Influenza B by PCR NEGATIVE NEGATIVE Final    Comment: (NOTE) The Xpert Xpress SARS-CoV-2/FLU/RSV plus assay is intended as an aid in the diagnosis of influenza from Nasopharyngeal swab specimens and should not be used as a sole basis for treatment. Nasal washings and aspirates are unacceptable for Xpert Xpress SARS-CoV-2/FLU/RSV testing.  Fact Sheet for Patients: BloggerCourse.com  Fact Sheet for Healthcare Providers: SeriousBroker.it  This test is not yet approved or cleared by the [place] FDA and has been authorized for detection and/or diagnosis of SARS-CoV-2 by FDA under an Emergency Use Authorization (EUA). This EUA will remain in effect (meaning this  test can be used) for the duration of the COVID-19 declaration under Section 564(b)(1) of the Act, 21 U.S.C. section 360bbb-3(b)(1), unless the authorization is terminated or revoked.  Performed at [facility], [address] [place], [place] [address]   Culture, blood (routine x 2) Call MD if unable to obtain prior to antibiotics being given     Status: None (Preliminary result)   Collection Time: 11/06/20 12:46 AM   Specimen: BLOOD  Result Value Ref Range Status   Specimen Description BLOOD LEFT ANTECUBITAL  Final    Special Requests   Final    BOTTLES DRAWN AEROBIC AND ANAEROBIC Blood Culture adequate volume   Culture   Final    NO GROWTH 2 DAYS Performed at [facility], [address] [place], [place] [address]    Report Status PENDING  Incomplete  Culture, blood (routine x 2) Call MD if unable to obtain prior to antibiotics being given     Status: None (Preliminary result)   Collection Time: 11/06/20 12:46 AM   Specimen: BLOOD  Result Value Ref Range Status   Specimen Description BLOOD BLOOD LEFT FOREARM  Final   Special Requests   Final    BOTTLES DRAWN AEROBIC AND ANAEROBIC Blood Culture adequate volume   Culture   Final    NO GROWTH 2 DAYS Performed at [facility], [address] [place], [place] [address]    Report Status PENDING  Incomplete    Coagulation Studies: No results for input(s): LABPROT, INR in the last 72 hours.  Urinalysis:  Recent Labs  Lab 11/05/20 1727  COLORURINE YELLOW*  LABSPEC 1.017  PHURINE 5.0  GLUCOSEU >=500*  HGBUR SMALL*  BILIRUBINUR NEGATIVE  KETONESUR NEGATIVE  PROTEINUR >=300*  NITRITE NEGATIVE  LEUKOCYTESUR NEGATIVE    Lipid Panel:  No results found for: CHOL, TRIG, HDL, CHOLHDL, VLDL, LDLCALC  HgbA1C:  Lab Results  Component Value Date   HGBA1C 10.8 (H) 11/06/2020    Urine Drug Screen:  No results found for: LABOPIA, COCAINSCRNUR, LABBENZ, AMPHETMU, THCU, LABBARB  Alcohol Level: No results for input(s): ETH in the last 168 hours.  Other results: EKG: normal EKG, normal sinus rhythm, unchanged from previous tracings.  Imaging: CT HEAD WO CONTRAST  Result Date: 11/07/2020 CLINICAL DATA:  Mental status change, unknown cause. Additional provided: Generalized weakness, fall. EXAM: CT HEAD WITHOUT CONTRAST TECHNIQUE: Contiguous axial images were obtained from the base of the skull through the vertex without intravenous contrast. COMPARISON:  Head CT 11/05/2020. FINDINGS: Brain: Cerebral volume is normal for age.  Stable moderate ill-defined hypoattenuation within the cerebral white matter is nonspecific, but compatible chronic small vessel ischemic disease. There is no acute intracranial hemorrhage. No demarcated cortical infarct. No extra-axial fluid collection. No evidence of intracranial mass. No midline shift. Vascular: No hyperdense vessel.  Atherosclerotic calcifications. Skull: Normal. Negative for fracture or focal lesion. Sinuses/Orbits: Visualized orbits show no acute finding. Mild ethmoid sinus mucosal thickening. IMPRESSION: No evidence of acute intracranial abnormality. Moderate cerebral white matter chronic small vessel ischemic disease, stable as compared to the head CT of 11/05/2020. Mild ethmoid sinus mucosal thickening. Electronically Signed   By: Jackey Loge DO   On: 11/07/2020 14:17   US RENAL  Result Date: 11/08/2020 CLINICAL DATA:  Acute renal injury. EXAM: RENAL / URINARY TRACT ULTRASOUND COMPLETE COMPARISON:  No prior. FINDINGS: Right Kidney: Renal measurements: 10.5 x 5.1 x 5.8 cm = volume: 164 mL. Increased echogenicity. No mass or hydronephrosis. Left Kidney: Renal measurements:  11.0 x 5.6 x 5.5 cm = volume: 175 mL. Increased echogenicity. No mass or hydronephrosis. Bladder: Bladder is very distended at 2407 cc. The patient has a PureWick in place. Other: None. IMPRESSION: 1. Increased echogenicity both kidneys consistent with chronic medical renal disease. No hydronephrosis. 2. The bladder is very distended at 2407 CC. Electronically Signed   By: Maisie Fushomas  Register   On: 11/08/2020 09:02   US Abdomen Limited RUQ (LIVER/GB)  Result Date: 11/08/2020 CLINICAL DATA:  Transaminitis. EXAM: ULTRASOUND ABDOMEN LIMITED RIGHT UPPER QUADRANT COMPARISON:  No prior. FINDINGS: Gallbladder: Cholecystectomy. Common bile duct: Diameter: 3.0 mm. Liver: Liver has a heterogeneous echogenic parenchymal pattern. No definite focal hepatic abnormality identified however focal hepatic abnormality would be  difficult to detect due to the heterogeneous echogenic parenchymal pattern. Hepatic contour is slightly irregular. Cirrhosis cannot be excluded. Portal vein is patent on color Doppler imaging with normal direction of blood flow towards the liver. Other: None. IMPRESSION: 1. Cholecystectomy. No biliary distention. 2. Heterogeneous increased hepatic echogenicity consistent with fatty infiltration or hepatocellular disease. Hepatic contour is slightly irregular. Cirrhosis cannot be excluded. Electronically Signed   By: Maisie Fushomas  Register   On: 11/08/2020 10:32     Assessment/Plan:  59 y.o. female with medical history significant ofdiabetes, hypertension, diabetic neuropathy, history of CVA, hyperlipidemia brought in to the ED on 11/05/20 by family secondary to generalized weakness and a fall [place].  Pt was found to have elevated WBC, AKI in the setting of PNA.    - Appears to have improved since admission - non focal on examination - CTH no acute abnormalities - Pt was started on antibiotics - Pt's condition is likely metabolic related with AKI and PNA - I am not convinced there is a need for EEG or MRI given the improvement and the above considerations for her mentation  11/08/2020, 10:44 AM

## 2020-11-08 NOTE — Progress Notes (Signed)
[facility]  ROUNDING NOTE   Subjective:   Tina Gregory is 59 years old African-American female with history of diabetes mellitus, hypertension, diabetic neuropathy, CVA, hyperlipidemia who was admitted on 11/05/2020 with community-acquired pneumonia and acute kidney injury. Patient resting in bed, in no acute distress.  She has complaints of nasal bleeding, has nasal cannula off of her nostrils.  She is saturating 95% on room air, denies worsening shortness of breath.  Objective:  Vital signs in last 24 hours:  Temp:  [97.7 F (36.5 C)-98.9 F (37.2 C)] 98.9 F (37.2 C) (11/23 0925) Pulse Rate:  [74-80] 80 (11/23 0925) Resp:  [14-20] 20 (11/23 0925) BP: (91-139)/(63-79) 139/79 (11/23 0925) SpO2:  [93 %-98 %] 95 % (11/23 0925)  Weight change:  Filed Weights   11/05/20 1423  Weight: 109.3 kg    Intake/Output: I/O last 3 completed shifts: In: 950 [IV Piggyback:950] Out: -    Intake/Output this shift:  Total I/O In: -  Out: 2500 [Urine:2500]  Physical Exam: General: No acute distress  Head: Normocephalic, atraumatic. Moist oral mucosal membranes  Eyes: Sclerae and conjunctivae clear  Lungs:  Bilateral rhonchi, diminished at the bases  Heart: Regular rate and rhythm,HR in 70's  Abdomen:  Soft, nontender, obese  Extremities: Trace  peripheral edema.  Neurologic: Awake, alert, moving all four extremities  Skin: No acute lesions or rashes    Basic Metabolic Panel: Recent Labs  Lab 11/05/20 1402 11/05/20 1402 11/06/20 0046 11/07/20 0933 11/08/20 0109 11/08/20 0554  NA 134*  --  135 136  --  137  K 3.8  --  3.7 4.0  --  4.0  CL 102  --  107 105  --  106  CO2 20*  --  18* 19*  --  18*  GLUCOSE 151*  --  221* 94 100* 92  BUN 44*  --  44* 49*  --  51*  CREATININE 2.96*  --  2.96* 3.76*  --  4.52*  CALCIUM 9.0   < > 8.4* 8.8*  --  8.8*   < > = values in this interval not displayed.    Liver Function Tests: Recent Labs  Lab 11/06/20 0046  11/07/20 0933 11/08/20 0554  AST 64* 107* 93*  ALT 53* 62* 55*  ALKPHOS 132* 193* 205*  BILITOT 1.1 1.3* 1.4*  PROT 7.0 6.8 7.0  ALBUMIN 2.3* 2.0* 2.1*   No results for input(s): LIPASE, AMYLASE in the last 168 hours. Recent Labs  Lab 11/07/20 1425  AMMONIA 27    CBC: Recent Labs  Lab 11/05/20 1402 11/06/20 0046 11/07/20 0933 11/08/20 0554  WBC 17.8* 16.8* 12.2* 11.7*  NEUTROABS  --  12.0* 7.9*  --   HGB 11.5* 9.8* 9.5* 9.2*  HCT 35.4* 30.8* 29.8* 29.3*  MCV 81.8 81.7 81.9 82.3  PLT 196 174 183 203    Cardiac Enzymes: No results for input(s): CKTOTAL, CKMB, CKMBINDEX, TROPONINI in the last 168 hours.  BNP: Invalid input(s): POCBNP  CBG: Recent Labs  Lab 11/08/20 0133 11/08/20 0249 11/08/20 0358 11/08/20 0534 11/08/20 0909  GLUCAP 84 191* 96 120* 102*    Microbiology: Results for orders placed or performed during the hospital encounter of 11/05/20  Resp Panel by RT-PCR (Flu A&B, Covid) Nasopharyngeal Swab     Status: None   Collection Time: 11/05/20  9:36 PM   Specimen: Nasopharyngeal Swab; Nasopharyngeal(NP) swabs in vial transport medium  Result Value Ref Range Status   SARS Coronavirus 2 by RT  PCR NEGATIVE NEGATIVE Final    Comment: (NOTE) SARS-CoV-2 target nucleic acids are NOT DETECTED.  The SARS-CoV-2 RNA is generally detectable in upper respiratory specimens during the acute phase of infection. The lowest concentration of SARS-CoV-2 viral copies this assay can detect is 138 copies/mL. A negative result does not preclude SARS-Cov-2 infection and should not be used as the sole basis for treatment or other patient management decisions. A negative result may occur with  improper specimen collection/handling, submission of specimen other than nasopharyngeal swab, presence of viral mutation(s) within the areas targeted by this assay, and inadequate number of viral copies(<138 copies/mL). A negative result must be combined with clinical  observations, patient history, and epidemiological information. The expected result is Negative.  Fact Sheet for Patients:  BloggerCourse.com  Fact Sheet for Healthcare Providers:  SeriousBroker.it  This test is no t yet approved or cleared by the [place] FDA and  has been authorized for detection and/or diagnosis of SARS-CoV-2 by FDA under an Emergency Use Authorization (EUA). This EUA will remain  in effect (meaning this test can be used) for the duration of the COVID-19 declaration under Section 564(b)(1) of the Act, 21 U.S.C.section 360bbb-3(b)(1), unless the authorization is terminated  or revoked sooner.       Influenza A by PCR NEGATIVE NEGATIVE Final   Influenza B by PCR NEGATIVE NEGATIVE Final    Comment: (NOTE) The Xpert Xpress SARS-CoV-2/FLU/RSV plus assay is intended as an aid in the diagnosis of influenza from Nasopharyngeal swab specimens and should not be used as a sole basis for treatment. Nasal washings and aspirates are unacceptable for Xpert Xpress SARS-CoV-2/FLU/RSV testing.  Fact Sheet for Patients: BloggerCourse.com  Fact Sheet for Healthcare Providers: SeriousBroker.it  This test is not yet approved or cleared by the [place] FDA and has been authorized for detection and/or diagnosis of SARS-CoV-2 by FDA under an Emergency Use Authorization (EUA). This EUA will remain in effect (meaning this test can be used) for the duration of the COVID-19 declaration under Section 564(b)(1) of the Act, 21 U.S.C. section 360bbb-3(b)(1), unless the authorization is terminated or revoked.  Performed at [facility], [address] [place], [place] [address]   Culture, blood (routine x 2) Call MD if unable to obtain prior to antibiotics being given     Status: None (Preliminary result)   Collection Time: 11/06/20 12:46 AM   Specimen: BLOOD   Result Value Ref Range Status   Specimen Description BLOOD LEFT ANTECUBITAL  Final   Special Requests   Final    BOTTLES DRAWN AEROBIC AND ANAEROBIC Blood Culture adequate volume   Culture   Final    NO GROWTH 2 DAYS Performed at [facility], [address], [place], [place] [address]    Report Status PENDING  Incomplete  Culture, blood (routine x 2) Call MD if unable to obtain prior to antibiotics being given     Status: None (Preliminary result)   Collection Time: 11/06/20 12:46 AM   Specimen: BLOOD  Result Value Ref Range Status   Specimen Description BLOOD BLOOD LEFT FOREARM  Final   Special Requests   Final    BOTTLES DRAWN AEROBIC AND ANAEROBIC Blood Culture adequate volume   Culture   Final    NO GROWTH 2 DAYS Performed at [facility], [address] [place], [place] [address]    Report Status PENDING  Incomplete    Coagulation Studies: No results for input(s): LABPROT, INR in the last 72  hours.  Urinalysis: Recent Labs    11/05/20 1727  COLORURINE YELLOW*  LABSPEC 1.017  PHURINE 5.0  GLUCOSEU >=500*  HGBUR SMALL*  BILIRUBINUR NEGATIVE  KETONESUR NEGATIVE  PROTEINUR >=300*  NITRITE NEGATIVE  LEUKOCYTESUR NEGATIVE      Imaging: CT HEAD WO CONTRAST  Result Date: 11/07/2020 CLINICAL DATA:  Mental status change, unknown cause. Additional provided: Generalized weakness, fall. EXAM: CT HEAD WITHOUT CONTRAST TECHNIQUE: Contiguous axial images were obtained from the base of the skull through the vertex without intravenous contrast. COMPARISON:  Head CT 11/05/2020. FINDINGS: Brain: Cerebral volume is normal for age. Stable moderate ill-defined hypoattenuation within the cerebral white matter is nonspecific, but compatible chronic small vessel ischemic disease. There is no acute intracranial hemorrhage. No demarcated cortical infarct. No extra-axial fluid collection. No evidence of intracranial mass. No midline shift. Vascular: No hyperdense  vessel.  Atherosclerotic calcifications. Skull: Normal. Negative for fracture or focal lesion. Sinuses/Orbits: Visualized orbits show no acute finding. Mild ethmoid sinus mucosal thickening. IMPRESSION: No evidence of acute intracranial abnormality. Moderate cerebral white matter chronic small vessel ischemic disease, stable as compared to the head CT of 11/05/2020. Mild ethmoid sinus mucosal thickening. Electronically Signed   By: Jackey Loge DO   On: 11/07/2020 14:17   US RENAL  Result Date: 11/08/2020 CLINICAL DATA:  Acute renal injury. EXAM: RENAL / URINARY TRACT ULTRASOUND COMPLETE COMPARISON:  No prior. FINDINGS: Right Kidney: Renal measurements: 10.5 x 5.1 x 5.8 cm = volume: 164 mL. Increased echogenicity. No mass or hydronephrosis. Left Kidney: Renal measurements: 11.0 x 5.6 x 5.5 cm = volume: 175 mL. Increased echogenicity. No mass or hydronephrosis. Bladder: Bladder is very distended at 2407 cc. The patient has a PureWick in place. Other: None. IMPRESSION: 1. Increased echogenicity both kidneys consistent with chronic medical renal disease. No hydronephrosis. 2. The bladder is very distended at 2407 CC. Electronically Signed   By: Maisie Fus  Register   On: 11/08/2020 09:02   US Abdomen Limited RUQ (LIVER/GB)  Result Date: 11/08/2020 CLINICAL DATA:  Transaminitis. EXAM: ULTRASOUND ABDOMEN LIMITED RIGHT UPPER QUADRANT COMPARISON:  No prior. FINDINGS: Gallbladder: Cholecystectomy. Common bile duct: Diameter: 3.0 mm. Liver: Liver has a heterogeneous echogenic parenchymal pattern. No definite focal hepatic abnormality identified however focal hepatic abnormality would be difficult to detect due to the heterogeneous echogenic parenchymal pattern. Hepatic contour is slightly irregular. Cirrhosis cannot be excluded. Portal vein is patent on color Doppler imaging with normal direction of blood flow towards the liver. Other: None. IMPRESSION: 1. Cholecystectomy. No biliary distention. 2. Heterogeneous  increased hepatic echogenicity consistent with fatty infiltration or hepatocellular disease. Hepatic contour is slightly irregular. Cirrhosis cannot be excluded. Electronically Signed   By: Maisie Fus  Register   On: 11/08/2020 10:32     Medications:   . sodium chloride 50 mL/hr at 11/07/20 2159  . azithromycin    . cefTRIAXone (ROCEPHIN)  IV 2 g (11/08/20 0957)  . dextrose 75 mL/hr at 11/08/20 0856   . allopurinol  200 mg Oral Daily  . amitriptyline  50 mg Oral QHS  . aspirin EC  81 mg Oral Daily  . carvedilol  25 mg Oral BID  . Chlorhexidine Gluconate Cloth  6 each Topical Daily  . cloNIDine  0.2 mg Oral BID  . clopidogrel  75 mg Oral Daily  . enoxaparin (LOVENOX) injection  40 mg Subcutaneous Q24H  . feeding supplement  237 mL Oral TID with meals  . insulin aspart  0-15 Units Subcutaneous TID WC  . insulin  aspart  0-5 Units Subcutaneous QHS  . insulin aspart  5 Units Subcutaneous TID WC  . insulin detemir  30 Units Subcutaneous BID  . levothyroxine  25 mcg Oral Daily  . multivitamin with minerals  1 tablet Oral Daily   dextrose, guaiFENesin-dextromethorphan  Assessment/ Plan:  Tina Gregory is a 59 y.o.  female with history of diabetes mellitus, hypertension, diabetic neuropathy, CVA, hyperlipidemia who was admitted on 11/05/2020 with community-acquired pneumonia and acute kidney injury.  # Acute kidney injury with proteinuria Baseline creatinine unknown Lab Results  Component Value Date   CREATININE 4.52 (H) 11/08/2020   CREATININE 3.76 (H) 11/07/2020   CREATININE 2.96 (H) 11/06/2020   US Renal on 11/08/2020   LOIMPRESSION: 1. Increased echogenicity both kidneys consistent with chronic medical renal disease. No hydronephrosis. 2. The bladder is very distended at 2407 cc  Patient has a F/C placed, draining clear yellow output.  Continue strict I&O monitoring  IV fluid at 50 ml/hr Will continue monitoring renal function closely No acute indication for dialysis  today Continue holding Nephrotoxic agents  #Metabolic Acidosis  CO2 18  today IV fluid changed to Sodium bicarbonate infusion  #Diabetes type II with CKD Patient is on insulin Aspart and Insulin Detemir Blood glucose readings within acceptable range  #Altered mental status #Community-acquired pneumonia Patient is on antibiotics for pneumonia Neurology following up for altered mental status Patient awake, alert, able to tell her name and place    S 3 Shamara Soza 11/23/202111:04 AM

## 2020-11-08 NOTE — TOC Initial Note (Signed)
Transition of Care [facility]) - Initial/Assessment Note    Patient Details  Name: Tina Gregory MRN: [id number] Date of Birth: [birth year]  Transition of Care [facility] CM/SW Contact:    Shelbie Hutching, RN Phone Number: 11/08/2020, 2:50 PM  Clinical Narrative:                 Patient admitted to the hospital with pneumonia.  RNCM met with patient at the bedside this morning.  Patient reports that she lives in [place], which is close to [place], with her daughter.  Patient reports being independent in ADL's.  She has a walker, 3 in 1, and shower chair at home.  Patient drives.  Patient reports that she is open with [facility] for home health nursing and PT.  Patient prefers to go home over SNF.   TOC to follow and assist with discharge planning.   Expected Discharge Plan: [place] Barriers to Discharge: Continued Medical Work up   Patient Goals and CMS Choice Patient states their goals for this hospitalization and ongoing recovery are:: "when I walk out of here I want to be well" CMS Medicare.gov Compare Post Acute Care list provided to:: Patient Choice offered to / list presented to : Patient  Expected Discharge Plan and Services Expected Discharge Plan: [place]   Discharge Planning Services: CM Consult Post Acute Care Choice: Home Health, Resumption of Svcs/PTA Provider Living arrangements for the past 2 months: Single Family Home                   DME Agency: NA       HH Arranged: RN, PT [place] Agency: [facility]        Prior Living Arrangements/Services Living arrangements for the past 2 months: Single Family Home Lives with:: Adult Children Patient language and need for interpreter reviewed:: Yes Do you feel safe going back to the place where you live?: Yes      Need for Family Participation in Patient Care: Yes (Comment) (pneumonia) Care giver support system in place?: Yes (comment) (daughter) Current home services: DME (walker,  3 in 1, shower chair) Criminal Activity/Legal Involvement Pertinent to Current Situation/Hospitalization: No - Comment as needed  Activities of Daily Living Home Assistive Devices/Equipment: Walker (specify type) ADL Screening (condition at time of admission) Patient's cognitive ability adequate to safely complete daily activities?: Yes Is the patient deaf or have difficulty hearing?: No Does the patient have difficulty seeing, even when wearing glasses/contacts?: No Does the patient have difficulty concentrating, remembering, or making decisions?: No Patient able to express need for assistance with ADLs?: Yes Does the patient have difficulty dressing or bathing?: No Does the patient have difficulty walking or climbing stairs?: Yes Weakness of Legs: Both Weakness of Arms/Hands: None  Permission Sought/Granted Permission sought to share information with : Case Manager, Other (comment) Permission granted to share information with : Yes, Verbal Permission Granted     Permission granted to share info w AGENCY: [facility]        Emotional Assessment Appearance:: Appears stated age Attitude/Demeanor/Rapport: Engaged Affect (typically observed): Accepting Orientation: : Oriented to Self, Oriented to Place, Oriented to  Time, Oriented to Situation Alcohol / Substance Use: Not Applicable Psych Involvement: No (comment)  Admission diagnosis:  CAP (community acquired pneumonia) [J18.9] AKI (acute kidney injury) ([place]) [N17.9] Community acquired pneumonia of right lower lobe of lung [J18.9] Patient Active Problem List   Diagnosis Date Noted  . AKI (acute kidney  injury) ([place]) 11/05/2020  . Diabetes ([place]) 11/05/2020  . Essential hypertension 11/05/2020  . Hyperlipemia 11/05/2020  . CAP (community acquired pneumonia) 11/05/2020   PCP:  Merryl Hacker No Pharmacy:   [facility] [address], [place] - [address] [place], [place] [address] Phone: [phone] Fax:  [phone]     Social Determinants of Health (SDOH) Interventions    Readmission Risk Interventions No flowsheet data found.

## 2020-11-09 DIAGNOSIS — E1142 Type 2 diabetes mellitus with diabetic polyneuropathy: Secondary | ICD-10-CM | POA: Diagnosis not present

## 2020-11-09 DIAGNOSIS — I10 Essential (primary) hypertension: Secondary | ICD-10-CM | POA: Diagnosis not present

## 2020-11-09 DIAGNOSIS — R338 Other retention of urine: Secondary | ICD-10-CM

## 2020-11-09 DIAGNOSIS — N179 Acute kidney failure, unspecified: Secondary | ICD-10-CM | POA: Diagnosis not present

## 2020-11-09 DIAGNOSIS — G9341 Metabolic encephalopathy: Secondary | ICD-10-CM

## 2020-11-09 DIAGNOSIS — J189 Pneumonia, unspecified organism: Secondary | ICD-10-CM | POA: Diagnosis not present

## 2020-11-09 LAB — RENAL FUNCTION PANEL
Albumin: 1.9 g/dL — ABNORMAL LOW (ref 3.5–5.0)
Anion gap: 12 (ref 5–15)
BUN: 52 mg/dL — ABNORMAL HIGH (ref 6–20)
CO2: 19 mmol/L — ABNORMAL LOW (ref 22–32)
Calcium: 8.8 mg/dL — ABNORMAL LOW (ref 8.9–10.3)
Chloride: 106 mmol/L (ref 98–111)
Creatinine, Ser: 4.91 mg/dL — ABNORMAL HIGH (ref 0.44–1.00)
GFR, Estimated: 10 mL/min — ABNORMAL LOW (ref >60)
Glucose, Bld: 57 mg/dL — ABNORMAL LOW (ref 70–99)
Phosphorus: 6.1 mg/dL — ABNORMAL HIGH (ref 2.5–4.6)
Potassium: 3.8 mmol/L (ref 3.5–5.1)
Sodium: 137 mmol/L (ref 135–145)

## 2020-11-09 LAB — GLUCOSE, CAPILLARY
Glucose-Capillary: 130 mg/dL — ABNORMAL HIGH (ref 70–99)
Glucose-Capillary: 45 mg/dL — ABNORMAL LOW (ref 70–99)
Glucose-Capillary: 103 mg/dL — ABNORMAL HIGH (ref 70–99)
Glucose-Capillary: 126 mg/dL — ABNORMAL HIGH (ref 70–99)
Glucose-Capillary: 87 mg/dL (ref 70–99)
Glucose-Capillary: 66 mg/dL — ABNORMAL LOW (ref 70–99)
Glucose-Capillary: 94 mg/dL (ref 70–99)

## 2020-11-09 LAB — PROTEIN ELECTROPHORESIS, SERUM
A/G Ratio: 0.5 — ABNORMAL LOW (ref 0.7–1.7)
Albumin ELP: 2 g/dL — ABNORMAL LOW (ref 2.9–4.4)
Alpha-1-Globulin: 0.4 g/dL (ref 0.0–0.4)
Alpha-2-Globulin: 1.2 g/dL — ABNORMAL HIGH (ref 0.4–1.0)
Beta Globulin: 1.3 g/dL (ref 0.7–1.3)
Gamma Globulin: 1.1 g/dL (ref 0.4–1.8)
Globulin, Total: 4 g/dL — ABNORMAL HIGH (ref 2.2–3.9)
Total Protein ELP: 6 g/dL (ref 6.0–8.5)

## 2020-11-09 LAB — ECHOCARDIOGRAM COMPLETE
Area-P 1/2: 3.99 cm2
Height: 65 in
S' Lateral: 3.09 cm
Weight: 3856 oz

## 2020-11-09 LAB — PROTEIN ELECTRO, RANDOM URINE
Albumin ELP, Urine: 43.6 %
Alpha-1-Globulin, U: 3.7 %
Alpha-2-Globulin, U: 10.2 %
Beta Globulin, U: 23.5 %
Gamma Globulin, U: 19 %
Total Protein, Urine: 464.2 mg/dL

## 2020-11-09 LAB — CBC
HCT: 29 % — ABNORMAL LOW (ref 36.0–46.0)
Hemoglobin: 9.3 g/dL — ABNORMAL LOW (ref 12.0–15.0)
MCH: 26.3 pg (ref 26.0–34.0)
MCHC: 32.1 g/dL (ref 30.0–36.0)
MCV: 81.9 fL (ref 80.0–100.0)
Platelets: 204 10*3/uL (ref 150–400)
RBC: 3.54 MIL/uL — ABNORMAL LOW (ref 3.87–5.11)
RDW: 18.6 % — ABNORMAL HIGH (ref 11.5–15.5)
WBC: 12.5 10*3/uL — ABNORMAL HIGH (ref 4.0–10.5)
nRBC: 0 % (ref 0.0–0.2)

## 2020-11-09 LAB — URINE CULTURE: Culture: NO GROWTH

## 2020-11-09 MED ORDER — HEPARIN SODIUM (PORCINE) 5000 UNIT/ML IJ SOLN
5000.0000 [IU] | Freq: Three times a day (TID) | INTRAMUSCULAR | Status: DC
  Administered 2020-11-10 – 2020-11-15 (×16): 5000 [IU] via SUBCUTANEOUS
  Filled 2020-11-09 (×16): qty 1

## 2020-11-09 MED ORDER — AMITRIPTYLINE HCL 10 MG PO TABS
10.0000 mg | ORAL_TABLET | Freq: Every day | ORAL | Status: DC
  Administered 2020-11-09 – 2020-11-14 (×6): 10 mg via ORAL
  Filled 2020-11-09 (×8): qty 1

## 2020-11-09 MED ORDER — SODIUM CHLORIDE 0.9 % IV SOLN
1.0000 g | INTRAVENOUS | Status: AC
  Administered 2020-11-10: 1 g via INTRAVENOUS
  Filled 2020-11-09: qty 10

## 2020-11-09 MED ORDER — INSULIN DETEMIR 100 UNIT/ML ~~LOC~~ SOLN
25.0000 [IU] | Freq: Two times a day (BID) | SUBCUTANEOUS | Status: DC
  Administered 2020-11-10: 25 [IU] via SUBCUTANEOUS
  Filled 2020-11-09 (×3): qty 0.25

## 2020-11-09 MED ORDER — DEXTROSE 50 % IV SOLN
INTRAVENOUS | Status: AC
  Administered 2020-11-09: 08:00:00 50 mL via INTRAVENOUS
  Filled 2020-11-09: qty 50

## 2020-11-09 MED ORDER — INSULIN ASPART 100 UNIT/ML ~~LOC~~ SOLN: 3.0000 [IU] | Freq: Three times a day (TID) | SUBCUTANEOUS | Status: DC

## 2020-11-09 NOTE — Progress Notes (Signed)
Blood sugar- 45, treated with D50, on recheck patients blood glucose 130. Notified MD

## 2020-11-09 NOTE — Progress Notes (Addendum)
PROGRESS NOTE  Tina Gregory PYP:[id number] DOB: May 25, 1961 DOA: 11/05/2020 PCP: Pcp, No   LOS: 4 days   Brief narrative: As per HPI,  Tina McGeeis a 59 y.o.femalewith medical history significant ofdiabetes, hypertension, diabetic neuropathy, history of CVA, hyperlipidemia was brought in to the ED on 11/05/20 by secondary to generalized weakness and a fall while trying to get in a wheelchair in [place] parking lot.      Evaluation in the emergency department revealed a leukocytosis with low-grade fever tachycardia tachypnea.  Her creatinine level was elevated at 2.9.  Chest x-ray showed bibasilar opacity concerning for pneumonia.  X-ray of the left knee was unremarkable.  Patient was then admitted to hospital for community-acquired pneumonia and acute kidney injury.  Assessment/Plan:  Principal Problem:   CAP (community acquired pneumonia) Active Problems:   AKI (acute kidney injury) (HCC)   Diabetes (HCC)   Essential hypertension   Hyperlipemia  Severe sepsis due to Community-acquired pneumonia -present on admission.  Continue Rocephin and Zithromax IV.  Continue supportive care.  Patient will complete 5-day course of antibiotic by tomorrow.  Decrease the dose of Rocephin to 1 g daily.  Last dose tomorrow.  Acute urinary retention -avoid anticholinergics.  On Foley catheter.  Voiding trial likely by tomorrow.  Patient had 2500 mL of urine in the bladder yesterday.  Acute kidney injury with proteinuria, metabolic acidosis.  Creatinine of 4.9 today, slightly worse from 4.7 yesterday. continue with Foley catheter placement.  Could be secondary to obstructive uropathy.  Renal ultrasound shows no hydronephrosis.  Patient does have chronic medical renal disease but very distended bowel bladder.  Nephrology on board.  Continue to hold Lasix and losartan and renally dose medication.  On sodium bicarb drip.  Acute metabolic encephalopathy -likely multifactorial from wound infection and  uremia.  Arterial blood gas without hypercarbia.  CT head scan was negative.  Ammonia levels were normal as well.  MRI showed chronic microvascular changes.  Patient was also seen by neurology.  Rocephin can cause encephalopathy as well.  Will change her dose of Elavil to 10 mg at nighttime from 50 mg.  Decrease Rocephin to 1 g daily-  Last dose tomorrow.  Elevated LFTs.-Unclear.  Right upper quadrant ultrasound showed increased echogenicity.  Slightly irregular.  Cirrhosis could not be excluded.  Will need outpatient follow-up.    Likely OSA  -will benefit from outpatient sleep study.  ABG without hypercarbia.  Type 2 diabetes with neuropathy -patient is on Levemir 35 units BID, Novolog 20-25 units TID WC and Trulicity at home.  Hold off with Lyrica.  Continue sliding scale insulin, Levemir at reduced dose. NovoLog 3 times daily.  Continue to monitor CBGs..  Essential hypertension   Coreg, clonidine, Lasix and losartan at home.  Continue to hold losartan and Lasix due to AKI.  Continue Coreg and clonidine.  Blood pressure is stable.  Hx of CVA   continue aspirin and Plavix.  MRI of the brain was negative for acute findings.   Depression / Anxiety - continue Elavil  Hypothyroidism - continue levothyroxine  Gout -hold allopurinol   Morbid obesity: Body mass index is 40.1 kg/m.    Would benefit from lifestyle modification to lose weight as outpatient.  DVT prophylaxis: Heparin subcu   Code Status: Full code  Family Communication: None today  Status is: Inpatient  Remains inpatient appropriate because:IV treatments appropriate due to intensity of illness or inability to take PO, Inpatient level of care appropriate due to severity of illness and  Likely need for skilled nursing facility placement, IV antibiotics, acute kidney injury, metabolic encephalopathy   Dispo: The patient is from: Home              Anticipated d/c is to: SNF              Anticipated d/c date is: 2  days              Patient currently is not medically stable to d/c.  Consultants:  Nephrology  Neurology   Procedures:  Foley catheter placement  Antibiotics:  . Rocephin and Zithromax 11/20>  Anti-infectives (From admission, onward)   Start     Dose/Rate Route Frequency Ordered Stop   11/08/20 1800  azithromycin (ZITHROMAX) 500 mg in sodium chloride 0.9 % 250 mL IVPB        500 mg 250 mL/hr over 60 Minutes Intravenous Every 24 hours 11/07/20 2250 11/11/20 1759   11/07/20 2200  azithromycin (ZITHROMAX) tablet 500 mg  Status:  Discontinued        500 mg Oral Daily 11/07/20 1407 11/07/20 2249   11/06/20 1000  cefTRIAXone (ROCEPHIN) 2 g in sodium chloride 0.9 % 100 mL IVPB        2 g 200 mL/hr over 30 Minutes Intravenous Every 24 hours 11/05/20 2311 11/11/20 0959   11/05/20 2330  azithromycin (ZITHROMAX) 500 mg in sodium chloride 0.9 % 250 mL IVPB  Status:  Discontinued        500 mg 250 mL/hr over 60 Minutes Intravenous Every 24 hours 11/05/20 2311 11/07/20 1407   11/05/20 1900  cefTRIAXone (ROCEPHIN) 1 g in sodium chloride 0.9 % 100 mL IVPB        1 g 200 mL/hr over 30 Minutes Intravenous  Once 11/05/20 1852 11/05/20 2112   11/05/20 0000  azithromycin (ZITHROMAX Z-PAK) 250 MG tablet           11/05/20 2047       Subjective: Today, patient was seen and examined at bedside.  Patient denies any pain, nausea, vomiting.  Denies any cough or shortness of breath or chest pain.  Feels somnolent at times.  States that she did have a bowel movement yesterday.    Objective: Vitals:   11/09/20 0441 11/09/20 0745  BP: 130/73 125/73  Pulse: 74 70  Resp: 20 20  Temp: 97.9 F (36.6 C) 98.9 F (37.2 C)  SpO2: 97% 100%    Intake/Output Summary (Last 24 hours) at 11/09/2020 0811 Last data filed at 11/09/2020 0600 Gross per 24 hour  Intake 2795.55 ml  Output 4050 ml  Net -1254.45 ml   Filed Weights   11/05/20 1423  Weight: 109.3 kg   Body mass index is 40.1 kg/m.    Physical Exam:  GENERAL: Patient is obese, alert awake on verbal command, somnolent at times,  Not in obvious distress, on room air HENT: No scleral pallor or icterus. Pupils equally reactive to light. Oral mucosa is moist NECK: is supple, no gross swelling noted. CHEST:   Diminished breath sounds bilaterally. CVS: S1 and S2 heard, no murmur. Regular rate and rhythm.  ABDOMEN: Soft, non-tender, bowel sounds are present, Foley catheter in place EXTREMITIES: Trace peripheral edema CNS: Cranial nerves are intact. No focal motor deficits.  Generalized weakness noted, mildly somnolent SKIN: warm and dry without rashes.  Data Review: I have personally reviewed the following laboratory data and studies,  CBC: Recent Labs  Lab 11/05/20 1402 11/06/20 0046 11/07/20 0933 11/08/20 0554 11/09/20 0545  WBC 17.8* 16.8* 12.2* 11.7* 12.5*  NEUTROABS  --  12.0* 7.9*  --   --   HGB 11.5* 9.8* 9.5* 9.2* 9.3*  HCT 35.4* 30.8* 29.8* 29.3* 29.0*  MCV 81.8 81.7 81.9 82.3 81.9  PLT 196 174 183 203 204   Basic Metabolic Panel: Recent Labs  Lab 11/06/20 0046 11/06/20 0046 11/07/20 0933 11/08/20 0109 11/08/20 0554 11/08/20 1822 11/09/20 0545  NA 135  --  136  --  137 134* 137  K 3.7  --  4.0  --  4.0 4.1 3.8  CL 107  --  105  --  106 104 106  CO2 18*  --  19*  --  18* 18* 19*  GLUCOSE 221*   < > 94 100* 92 268* 57*  BUN 44*  --  49*  --  51* 49* 52*  CREATININE 2.96*  --  3.76*  --  4.52* 4.77* 4.91*  CALCIUM 8.4*  --  8.8*  --  8.8* 8.5* 8.8*  PHOS  --   --   --   --   --   --  6.1*   < > = values in this interval not displayed.   Liver Function Tests: Recent Labs  Lab 11/06/20 0046 11/07/20 0933 11/08/20 0554 11/08/20 1822 11/09/20 0545  AST 64* 107* 93* 126*  --   ALT 53* 62* 55* 64*  --   ALKPHOS 132* 193* 205* 260*  --   BILITOT 1.1 1.3* 1.4* 1.9*  --   PROT 7.0 6.8 7.0 7.2  --   ALBUMIN 2.3* 2.0* 2.1* 1.9* 1.9*   No results for input(s): LIPASE, AMYLASE in the last 168  hours. Recent Labs  Lab 11/07/20 1425  AMMONIA 27   Cardiac Enzymes: No results for input(s): CKTOTAL, CKMB, CKMBINDEX, TROPONINI in the last 168 hours. BNP (last 3 results) No results for input(s): BNP in the last 8760 hours.  ProBNP (last 3 results) No results for input(s): PROBNP in the last 8760 hours.  CBG: Recent Labs  Lab 11/08/20 1138 11/08/20 1605 11/08/20 2135 11/09/20 0747 11/09/20 0808  GLUCAP 94 264* 168* 45* 130*   Recent Results (from the past 240 hour(s))  Resp Panel by RT-PCR (Flu A&B, Covid) Nasopharyngeal Swab     Status: None   Collection Time: 11/05/20  9:36 PM   Specimen: Nasopharyngeal Swab; Nasopharyngeal(NP) swabs in vial transport medium  Result Value Ref Range Status   SARS Coronavirus 2 by RT PCR NEGATIVE NEGATIVE Final    Comment: (NOTE) SARS-CoV-2 target nucleic acids are NOT DETECTED.  The SARS-CoV-2 RNA is generally detectable in upper respiratory specimens during the acute phase of infection. The lowest concentration of SARS-CoV-2 viral copies this assay can detect is 138 copies/mL. A negative result does not preclude SARS-Cov-2 infection and should not be used as the sole basis for treatment or other patient management decisions. A negative result may occur with  improper specimen collection/handling, submission of specimen other than nasopharyngeal swab, presence of viral mutation(s) within the areas targeted by this assay, and inadequate number of viral copies(<138 copies/mL). A negative result must be combined with clinical observations, patient history, and epidemiological information. The expected result is Negative.  Fact Sheet for Patients:  BloggerCourse.com  Fact Sheet for Healthcare Providers:  SeriousBroker.it  This test is no t yet approved or cleared by the [place] FDA and  has been authorized for detection and/or diagnosis of SARS-CoV-2 by FDA under an Emergency  Use Authorization (EUA).  This EUA will remain  in effect (meaning this test can be used) for the duration of the COVID-19 declaration under Section 564(b)(1) of the Act, 21 U.S.C.section 360bbb-3(b)(1), unless the authorization is terminated  or revoked sooner.       Influenza A by PCR NEGATIVE NEGATIVE Final   Influenza B by PCR NEGATIVE NEGATIVE Final    Comment: (NOTE) The Xpert Xpress SARS-CoV-2/FLU/RSV plus assay is intended as an aid in the diagnosis of influenza from Nasopharyngeal swab specimens and should not be used as a sole basis for treatment. Nasal washings and aspirates are unacceptable for Xpert Xpress SARS-CoV-2/FLU/RSV testing.  Fact Sheet for Patients: BloggerCourse.comhttps://www.fda.gov/media/152166/download  Fact Sheet for Healthcare Providers: SeriousBroker.ithttps://www.fda.gov/media/152162/download  This test is not yet approved or cleared by the [place] FDA and has been authorized for detection and/or diagnosis of SARS-CoV-2 by FDA under an Emergency Use Authorization (EUA). This EUA will remain in effect (meaning this test can be used) for the duration of the COVID-19 declaration under Section 564(b)(1) of the Act, 21 U.S.C. section 360bbb-3(b)(1), unless the authorization is terminated or revoked.  Performed at [facility], [address] [place], [place] [address]   Culture, blood (routine x 2) Call MD if unable to obtain prior to antibiotics being given     Status: None (Preliminary result)   Collection Time: 11/06/20 12:46 AM   Specimen: BLOOD  Result Value Ref Range Status   Specimen Description BLOOD LEFT ANTECUBITAL  Final   Special Requests   Final    BOTTLES DRAWN AEROBIC AND ANAEROBIC Blood Culture adequate volume   Culture   Final    NO GROWTH 2 DAYS Performed at [facility], [address] [place], [place] [address]    Report Status PENDING  Incomplete  Culture, blood (routine x 2) Call MD if unable to obtain prior to antibiotics being  given     Status: None (Preliminary result)   Collection Time: 11/06/20 12:46 AM   Specimen: BLOOD  Result Value Ref Range Status   Specimen Description BLOOD BLOOD LEFT FOREARM  Final   Special Requests   Final    BOTTLES DRAWN AEROBIC AND ANAEROBIC Blood Culture adequate volume   Culture   Final    NO GROWTH 2 DAYS Performed at [facility], [address] [place], [place] [address]    Report Status PENDING  Incomplete     Studies: CT HEAD WO CONTRAST  Result Date: 11/07/2020 CLINICAL DATA:  Mental status change, unknown cause. Additional provided: Generalized weakness, fall. EXAM: CT HEAD WITHOUT CONTRAST TECHNIQUE: Contiguous axial images were obtained from the base of the skull through the vertex without intravenous contrast. COMPARISON:  Head CT 11/05/2020. FINDINGS: Brain: Cerebral volume is normal for age. Stable moderate ill-defined hypoattenuation within the cerebral white matter is nonspecific, but compatible chronic small vessel ischemic disease. There is no acute intracranial hemorrhage. No demarcated cortical infarct. No extra-axial fluid collection. No evidence of intracranial mass. No midline shift. Vascular: No hyperdense vessel.  Atherosclerotic calcifications. Skull: Normal. Negative for fracture or focal lesion. Sinuses/Orbits: Visualized orbits show no acute finding. Mild ethmoid sinus mucosal thickening. IMPRESSION: No evidence of acute intracranial abnormality. Moderate cerebral white matter chronic small vessel ischemic disease, stable as compared to the head CT of 11/05/2020. Mild ethmoid sinus mucosal thickening. Electronically Signed   By: Jackey LogeKyle  Golden DO   On: 11/07/2020 14:17   MR BRAIN W WO CONTRAST  Result Date: 11/08/2020 CLINICAL DATA:  Mental status changes.  EXAM: MRI HEAD WITHOUT AND WITH CONTRAST TECHNIQUE: Multiplanar, multiecho pulse sequences of the brain and surrounding structures were obtained without and with intravenous contrast. CONTRAST:   10mL GADAVIST GADOBUTROL 1 MMOL/ML IV SOLN COMPARISON:  Head CT November 07, 2020. FINDINGS: Brain: No acute infarction, hemorrhage, hydrocephalus, extra-axial collection or mass lesion. Scattered and confluent foci of T2 hyperintensity are seen within the white matter of the cerebral hemispheres and within the pons, nonspecific, more pronounced than expected for age. No focus of abnormal contrast enhancement. Vascular: Normal flow voids. Skull and upper cervical spine: Normal marrow signal. Sinuses/Orbits: Mild mucosal thickening the bilateral ethmoid cells. The orbits are maintained. Other: Minimal bilateral mastoid effusion. IMPRESSION: 1. No acute intracranial abnormality. 2. Scattered and confluent foci of T2 hyperintensity within the white matter of the cerebral hemispheres and pons, nonspecific, more pronounced than expected for age. Differential considerations include chronic small vessel ischemic changes, vasculitis, demyelination or other infectious/inflammatory processes. Electronically Signed   By: Baldemar Lenis M.D.   On: 11/08/2020 15:42   US RENAL  Result Date: 11/08/2020 CLINICAL DATA:  Acute renal injury. EXAM: RENAL / URINARY TRACT ULTRASOUND COMPLETE COMPARISON:  No prior. FINDINGS: Right Kidney: Renal measurements: 10.5 x 5.1 x 5.8 cm = volume: 164 mL. Increased echogenicity. No mass or hydronephrosis. Left Kidney: Renal measurements: 11.0 x 5.6 x 5.5 cm = volume: 175 mL. Increased echogenicity. No mass or hydronephrosis. Bladder: Bladder is very distended at 2407 cc. The patient has a PureWick in place. Other: None. IMPRESSION: 1. Increased echogenicity both kidneys consistent with chronic medical renal disease. No hydronephrosis. 2. The bladder is very distended at 2407 CC. Electronically Signed   By: Maisie Fus  Register   On: 11/08/2020 09:02   US Abdomen Limited RUQ (LIVER/GB)  Result Date: 11/08/2020 CLINICAL DATA:  Transaminitis. EXAM: ULTRASOUND ABDOMEN LIMITED RIGHT  UPPER QUADRANT COMPARISON:  No prior. FINDINGS: Gallbladder: Cholecystectomy. Common bile duct: Diameter: 3.0 mm. Liver: Liver has a heterogeneous echogenic parenchymal pattern. No definite focal hepatic abnormality identified however focal hepatic abnormality would be difficult to detect due to the heterogeneous echogenic parenchymal pattern. Hepatic contour is slightly irregular. Cirrhosis cannot be excluded. Portal vein is patent on color Doppler imaging with normal direction of blood flow towards the liver. Other: None. IMPRESSION: 1. Cholecystectomy. No biliary distention. 2. Heterogeneous increased hepatic echogenicity consistent with fatty infiltration or hepatocellular disease. Hepatic contour is slightly irregular. Cirrhosis cannot be excluded. Electronically Signed   By: Maisie Fus  Register   On: 11/08/2020 10:32      Joycelyn Das, MD  [facility] 11/09/2020

## 2020-11-09 NOTE — Evaluation (Signed)
Physical Therapy Evaluation Patient Details Name: Tina Gregory MRN: [id number] DOB: [birth year] Today's Date: 11/09/2020   History of Present Illness  Pt is 59 y/o F with PMH: DM, HTN, HLD, diabetic neuropathy, and CVA. Pt brought to [facility] ED on 11/05/20 by family secondary to generalized weakness and a fall while trying to get in a wheelchair in [place] parking lot. Pt admitted d/t community-acquired pneumonia and AKI.  Clinical Impression  Patient seated in recliner upon arrival to room; sleeping in chair, trunk leaning towards L.  Awakens with mod cuing (tactile, verbal) from therapist; alertness waxes/wanes throughout remainder of session.  Generally weak and deconditioned throughout all extremities; noted with myoclonic jerking to bilat LEs with activation.  Very high risk for buckling, LOB with closed-chain activities as result. Mod assist for postural repositioning and support in chair (to maintain neutral alignment); mod assist for stand/squat pivot transfer to new chair (previous back support not holding).  Attempted to assist with meal tray; unable to maintain alertness for participation with session. Unsafe/unable for additional standing/gait attempts due to lethargy.  Will continue to assess/progress in subsequent sessions. Would benefit from skilled PT to address above deficits and promote optimal return to PLOF.; recommend transition to STR upon discharge from acute hospitalization.     Follow Up Recommendations SNF    Equipment Recommendations       Recommendations for Other Services       Precautions / Restrictions Precautions Precautions: Fall Restrictions Weight Bearing Restrictions: No      Mobility  Bed Mobility               General bed mobility comments: seated in recliner beginning/end of treatment session    Transfers Overall transfer level: Needs assistance   Transfers: Squat Pivot Transfers     Squat pivot transfers: Mod assist     General  transfer comment: requires UE support throughout; generally unsteady and unsafe  Ambulation/Gait             General Gait Details: unsafe/unable  Stairs            Wheelchair Mobility    Modified Rankin (Stroke Patients Only)       Balance Overall balance assessment: Needs assistance Sitting-balance support: No upper extremity supported;Feet supported Sitting balance-Leahy Scale: Fair       Standing balance-Leahy Scale: Poor Standing balance comment: requires bilat UE support at all times                             Pertinent Vitals/Pain Pain Assessment: No/denies pain    Home Living Family/patient expects to be discharged to:: Private residence Living Arrangements: Children Available Help at Discharge: Family;Available PRN/intermittently Type of Home: House Home Access: Stairs to enter Entrance Stairs-Rails: Right Entrance Stairs-Number of Steps: 3 Home Layout: One level        Prior Function Level of Independence: Independent with assistive device(s)         Comments: Per OT report, pt is questionable historian. She reports she is usually able to transfer herself and able to perform BADLs I'ly, some family assist for IADLs. States she "can drive" but that her daughter usually drives her. Endorses several falls in last year. States her knees just "give out".  Patient remains generally lethargic this date; difficulty to engage in sustained conversation.     Hand Dominance        Extremity/Trunk Assessment   Upper Extremity Assessment Upper Extremity  Assessment: Generalized weakness    Lower Extremity Assessment Lower Extremity Assessment: Generalized weakness (grossly 3+ to 4-/5 throughout; some myoclonic jerking to bilat LEs with activation)       Communication   Communication:  (mumbled at times due to waxing/waning alertness)  Cognition Arousal/Alertness: Lethargic Behavior During Therapy: Flat affect Overall Cognitive  Status: No family/caregiver present to determine baseline cognitive functioning                                 General Comments: oriented to self only; generally lethargic, frequently closing eyes, falling asleep if not directly stimulated (with voice or touch).      General Comments      Exercises Other Exercises Other Exercises: Repositioned to midline in chair multiple times throughout session (lists to left with fatigue), mod/max assist; set up and assisted with meal tray while upright in chair.  Utilized R UE to gather/stab food; able to complete indep with increased time/effort.  Brings food to mouth with decreased accuracy and motor control.  Multiple chews of bite, ultimately spitting food out in napkin (doesn't like).  Attempted to facilitate intake of other foods; patient declined, ultimately closing eyes and returning to sleep.   Assessment/Plan    PT Assessment Patient needs continued PT services  PT Problem List Decreased strength;Decreased range of motion;Decreased activity tolerance;Decreased balance;Decreased mobility;Decreased coordination;Decreased cognition;Decreased knowledge of use of DME;Decreased safety awareness;Decreased knowledge of precautions       PT Treatment Interventions DME instruction;Gait training;Functional mobility training;Therapeutic activities;Therapeutic exercise;Balance training;Patient/family education    PT Goals (Current goals can be found in the Care Plan section)  Acute Rehab PT Goals Patient Stated Goal: to go home PT Goal Formulation: With patient Time For Goal Achievement: 11/23/20 Potential to Achieve Goals: Fair    Frequency Min 2X/week   Barriers to discharge        Co-evaluation               AM-PAC PT "6 Clicks" Mobility  Outcome Measure Help needed turning from your back to your side while in a flat bed without using bedrails?: A Little Help needed moving from lying on your back to sitting on the side  of a flat bed without using bedrails?: A Little Help needed moving to and from a bed to a chair (including a wheelchair)?: A Lot Help needed standing up from a chair using your arms (e.g., wheelchair or bedside chair)?: A Lot Help needed to walk in hospital room?: A Lot Help needed climbing 3-5 steps with a railing? : Total 6 Click Score: 13    End of Session   Activity Tolerance: Patient limited by fatigue Patient left: in chair;with call bell/phone within reach;with chair alarm set Nurse Communication: Mobility status PT Visit Diagnosis: Muscle weakness (generalized) (M62.81);Difficulty in walking, not elsewhere classified (R26.2)    Time: 1025-8527 PT Time Calculation (min) (ACUTE ONLY): 26 min   Charges:   PT Evaluation $PT Eval Moderate Complexity: 1 Mod PT Treatments $Therapeutic Activity: 8-22 mins        Waverly Tarquinio H. Manson Passey, PT, DPT, NCS 11/09/20, 2:43 PM [phone]

## 2020-11-09 NOTE — Progress Notes (Signed)
Inpatient Diabetes Program Recommendations  AACE/ADA: New Consensus Statement on Inpatient Glycemic Control (2015)  Target Ranges:  Prepandial:   less than 140 mg/dL      Peak postprandial:   less than 180 mg/dL (1-2 hours)      Critically ill patients:  140 - 180 mg/dL   Lab Results  Component Value Date   GLUCAP 103 (H) 11/09/2020   HGBA1C 10.8 (H) 11/06/2020    Review of Glycemic Control Results for L[place]ISIA[place], SEARLES (MRN [id number]) as of 11/09/2020 09:21  Ref. Range 11/08/2020 05:34 11/08/2020 09:09 11/08/2020 11:38 11/08/2020 16:05 11/08/2020 21:35 11/09/2020 07:47 11/09/2020 08:08 11/09/2020 08:40  Glucose-Capillary Latest Ref Range: 70 - 99 mg/dL 716 (H) 967 (H) 94 893 (H) 168 (H) 45 (L) 130 (H) 103 (H)   Diabetes history: DM2 Outpatient Diabetes medications: Levemir 80 units qd + Novolog 20 units tid + Trulicity 0.75 qweek Current orders for Inpatient glycemic control: Levemir 30 units bid + Novolog 5 units tid meal coverage  Inpatient Diabetes Program Recommendations:   -Decrease Novolog correction to sensitive tid  -Decrease Levemir to 25 units bid -Decrease Novolog meal coverage to 3 units tid if eats 50% Secure chat sent to Dr. Tyson Babinski.  Thank you, Billy Fischer. Meriah Shands, RN, MSN, CDE  Diabetes Coordinator Inpatient Glycemic Control Team Team Pager [phone] (8am-5pm) 11/09/2020 9:27 AM

## 2020-11-09 NOTE — Progress Notes (Signed)
[facility]  ROUNDING NOTE   Subjective:   Tina Gregory is 59 years old African-American female with history of diabetes mellitus, hypertension, diabetic neuropathy, CVA, hyperlipidemia who was admitted on 11/05/2020 with community-acquired pneumonia and acute kidney injury.  Patient sleeping,opens eyes to call, but falls back to sleep quickly.  She has F/C  draining dark amber urine output.Total urine output for the preceding 24 hours is 4,050 ml.   Objective:  Vital signs in last 24 hours:  Temp:  [97.8 F (36.6 C)-98.9 F (37.2 C)] 98.9 F (37.2 C) (11/24 0745) Pulse Rate:  [70-83] 70 (11/24 0745) Resp:  [17-20] 20 (11/24 0745) BP: (125-151)/(73-85) 125/73 (11/24 0745) SpO2:  [92 %-100 %] 100 % (11/24 0745)  Weight change:  Filed Weights   11/05/20 1423  Weight: 109.3 kg    Intake/Output: I/O last 3 completed shifts: In: 2795.6 [I.V.:2414.1; IV Piggyback:381.4] Out: 4050 [Urine:4050]   Intake/Output this shift:  No intake/output data recorded.  Physical Exam: General: Sleeping in bed, in no acute distress  Head: Moist oral mucosal membranes  Eyes: Anicteric  Lungs:  Respiration even,unlabored, lungs diminished at the bases  Heart: S1S2, no rubs or gallops  Abdomen:  Soft, nontender, obese  Extremities: Trace  peripheral edema.  Neurologic: Sleeping, wakes up to call, but drowsy  Skin: No acute lesions or rashes    Basic Metabolic Panel: Recent Labs  Lab 11/06/20 0046 11/06/20 0046 11/07/20 0933 11/07/20 0933 11/08/20 0109 11/08/20 0554 11/08/20 1822 11/09/20 0545  NA 135  --  136  --   --  137 134* 137  K 3.7  --  4.0  --   --  4.0 4.1 3.8  CL 107  --  105  --   --  106 104 106  CO2 18*  --  19*  --   --  18* 18* 19*  GLUCOSE 221*   < > 94  --  100* 92 268* 57*  BUN 44*  --  49*  --   --  51* 49* 52*  CREATININE 2.96*  --  3.76*  --   --  4.52* 4.77* 4.91*  CALCIUM 8.4*   < > 8.8*   < >  --  8.8* 8.5* 8.8*  PHOS  --   --   --   --   --    --   --  6.1*   < > = values in this interval not displayed.    Liver Function Tests: Recent Labs  Lab 11/06/20 0046 11/07/20 0933 11/08/20 0554 11/08/20 1822 11/09/20 0545  AST 64* 107* 93* 126*  --   ALT 53* 62* 55* 64*  --   ALKPHOS 132* 193* 205* 260*  --   BILITOT 1.1 1.3* 1.4* 1.9*  --   PROT 7.0 6.8 7.0 7.2  --   ALBUMIN 2.3* 2.0* 2.1* 1.9* 1.9*   No results for input(s): LIPASE, AMYLASE in the last 168 hours. Recent Labs  Lab 11/07/20 1425  AMMONIA 27    CBC: Recent Labs  Lab 11/05/20 1402 11/06/20 0046 11/07/20 0933 11/08/20 0554 11/09/20 0545  WBC 17.8* 16.8* 12.2* 11.7* 12.5*  NEUTROABS  --  12.0* 7.9*  --   --   HGB 11.5* 9.8* 9.5* 9.2* 9.3*  HCT 35.4* 30.8* 29.8* 29.3* 29.0*  MCV 81.8 81.7 81.9 82.3 81.9  PLT 196 174 183 203 204    Cardiac Enzymes: No results for input(s): CKTOTAL, CKMB, CKMBINDEX, TROPONINI in the last 168  hours.  BNP: Invalid input(s): POCBNP  CBG: Recent Labs  Lab 11/08/20 1605 11/08/20 2135 11/09/20 0747 11/09/20 0808 11/09/20 0840  GLUCAP 264* 168* 45* 130* 103*    Microbiology: Results for orders placed or performed during the hospital encounter of 11/05/20  Resp Panel by RT-PCR (Flu A&B, Covid) Nasopharyngeal Swab     Status: None   Collection Time: 11/05/20  9:36 PM   Specimen: Nasopharyngeal Swab; Nasopharyngeal(NP) swabs in vial transport medium  Result Value Ref Range Status   SARS Coronavirus 2 by RT PCR NEGATIVE NEGATIVE Final    Comment: (NOTE) SARS-CoV-2 target nucleic acids are NOT DETECTED.  The SARS-CoV-2 RNA is generally detectable in upper respiratory specimens during the acute phase of infection. The lowest concentration of SARS-CoV-2 viral copies this assay can detect is 138 copies/mL. A negative result does not preclude SARS-Cov-2 infection and should not be used as the sole basis for treatment or other patient management decisions. A negative result may occur with  improper specimen  collection/handling, submission of specimen other than nasopharyngeal swab, presence of viral mutation(s) within the areas targeted by this assay, and inadequate number of viral copies(<138 copies/mL). A negative result must be combined with clinical observations, patient history, and epidemiological information. The expected result is Negative.  Fact Sheet for Patients:  BloggerCourse.comhttps://www.fda.gov/media/152166/download  Fact Sheet for Healthcare Providers:  SeriousBroker.ithttps://www.fda.gov/media/152162/download  This test is no t yet approved or cleared by the [place] FDA and  has been authorized for detection and/or diagnosis of SARS-CoV-2 by FDA under an Emergency Use Authorization (EUA). This EUA will remain  in effect (meaning this test can be used) for the duration of the COVID-19 declaration under Section 564(b)(1) of the Act, 21 U.S.C.section 360bbb-3(b)(1), unless the authorization is terminated  or revoked sooner.       Influenza A by PCR NEGATIVE NEGATIVE Final   Influenza B by PCR NEGATIVE NEGATIVE Final    Comment: (NOTE) The Xpert Xpress SARS-CoV-2/FLU/RSV plus assay is intended as an aid in the diagnosis of influenza from Nasopharyngeal swab specimens and should not be used as a sole basis for treatment. Nasal washings and aspirates are unacceptable for Xpert Xpress SARS-CoV-2/FLU/RSV testing.  Fact Sheet for Patients: BloggerCourse.comhttps://www.fda.gov/media/152166/download  Fact Sheet for Healthcare Providers: SeriousBroker.ithttps://www.fda.gov/media/152162/download  This test is not yet approved or cleared by the [place] FDA and has been authorized for detection and/or diagnosis of SARS-CoV-2 by FDA under an Emergency Use Authorization (EUA). This EUA will remain in effect (meaning this test can be used) for the duration of the COVID-19 declaration under Section 564(b)(1) of the Act, 21 U.S.C. section 360bbb-3(b)(1), unless the authorization is terminated or revoked.  Performed at [facility], [address] [place], [place] [address]   Culture, blood (routine x 2) Call MD if unable to obtain prior to antibiotics being given     Status: None (Preliminary result)   Collection Time: 11/06/20 12:46 AM   Specimen: BLOOD  Result Value Ref Range Status   Specimen Description BLOOD LEFT ANTECUBITAL  Final   Special Requests   Final    BOTTLES DRAWN AEROBIC AND ANAEROBIC Blood Culture adequate volume   Culture   Final    NO GROWTH 3 DAYS Performed at [facility], [address] [place], [place] [address]    Report Status PENDING  Incomplete  Culture, blood (routine x 2) Call MD if unable to obtain prior to antibiotics being given     Status: None (Preliminary result)  Collection Time: 11/06/20 12:46 AM   Specimen: BLOOD  Result Value Ref Range Status   Specimen Description BLOOD BLOOD LEFT FOREARM  Final   Special Requests   Final    BOTTLES DRAWN AEROBIC AND ANAEROBIC Blood Culture adequate volume   Culture   Final    NO GROWTH 3 DAYS Performed at [facility], [address] [place], [place] [address]    Report Status PENDING  Incomplete  Urine Culture     Status: None   Collection Time: 11/08/20  9:11 AM   Specimen: Urine, Random  Result Value Ref Range Status   Specimen Description   Final    URINE, RANDOM Performed at [facility], [address], [place], [place] [address]    Special Requests   Final    NONE Performed at [facility], [address] [place], [place] [address]    Culture   Final    NO GROWTH Performed at [facility], [address], [place], [place] [address]    Report Status 11/09/2020 FINAL  Final    Coagulation Studies: No results for input(s): LABPROT, INR in the last 72 hours.  Urinalysis: No results for input(s): COLORURINE, LABSPEC, PHURINE, GLUCOSEU, HGBUR, BILIRUBINUR, KETONESUR, PROTEINUR, UROBILINOGEN, NITRITE, LEUKOCYTESUR in the last 72 hours.  Invalid  input(s): APPERANCEUR    Imaging: CT HEAD WO CONTRAST  Result Date: 11/07/2020 CLINICAL DATA:  Mental status change, unknown cause. Additional provided: Generalized weakness, fall. EXAM: CT HEAD WITHOUT CONTRAST TECHNIQUE: Contiguous axial images were obtained from the base of the skull through the vertex without intravenous contrast. COMPARISON:  Head CT 11/05/2020. FINDINGS: Brain: Cerebral volume is normal for age. Stable moderate ill-defined hypoattenuation within the cerebral white matter is nonspecific, but compatible chronic small vessel ischemic disease. There is no acute intracranial hemorrhage. No demarcated cortical infarct. No extra-axial fluid collection. No evidence of intracranial mass. No midline shift. Vascular: No hyperdense vessel.  Atherosclerotic calcifications. Skull: Normal. Negative for fracture or focal lesion. Sinuses/Orbits: Visualized orbits show no acute finding. Mild ethmoid sinus mucosal thickening. IMPRESSION: No evidence of acute intracranial abnormality. Moderate cerebral white matter chronic small vessel ischemic disease, stable as compared to the head CT of 11/05/2020. Mild ethmoid sinus mucosal thickening. Electronically Signed   By: Jackey Loge DO   On: 11/07/2020 14:17   MR BRAIN W WO CONTRAST  Result Date: 11/08/2020 CLINICAL DATA:  Mental status changes. EXAM: MRI HEAD WITHOUT AND WITH CONTRAST TECHNIQUE: Multiplanar, multiecho pulse sequences of the brain and surrounding structures were obtained without and with intravenous contrast. CONTRAST:  62mL GADAVIST GADOBUTROL 1 MMOL/ML IV SOLN COMPARISON:  Head CT November 07, 2020. FINDINGS: Brain: No acute infarction, hemorrhage, hydrocephalus, extra-axial collection or mass lesion. Scattered and confluent foci of T2 hyperintensity are seen within the white matter of the cerebral hemispheres and within the pons, nonspecific, more pronounced than expected for age. No focus of abnormal contrast enhancement. Vascular:  Normal flow voids. Skull and upper cervical spine: Normal marrow signal. Sinuses/Orbits: Mild mucosal thickening the bilateral ethmoid cells. The orbits are maintained. Other: Minimal bilateral mastoid effusion. IMPRESSION: 1. No acute intracranial abnormality. 2. Scattered and confluent foci of T2 hyperintensity within the white matter of the cerebral hemispheres and pons, nonspecific, more pronounced than expected for age. Differential considerations include chronic small vessel ischemic changes, vasculitis, demyelination or other infectious/inflammatory processes. Electronically Signed   By: Baldemar Lenis M.D.   On: 11/08/2020 15:42   US  RENAL  Result Date: 11/08/2020 CLINICAL DATA:  Acute renal injury. EXAM: RENAL / URINARY TRACT ULTRASOUND COMPLETE COMPARISON:  No prior. FINDINGS: Right Kidney: Renal measurements: 10.5 x 5.1 x 5.8 cm = volume: 164 mL. Increased echogenicity. No mass or hydronephrosis. Left Kidney: Renal measurements: 11.0 x 5.6 x 5.5 cm = volume: 175 mL. Increased echogenicity. No mass or hydronephrosis. Bladder: Bladder is very distended at 2407 cc. The patient has a PureWick in place. Other: None. IMPRESSION: 1. Increased echogenicity both kidneys consistent with chronic medical renal disease. No hydronephrosis. 2. The bladder is very distended at 2407 CC. Electronically Signed   By: Maisie Fus  Register   On: 11/08/2020 09:02   US Abdomen Limited RUQ (LIVER/GB)  Result Date: 11/08/2020 CLINICAL DATA:  Transaminitis. EXAM: ULTRASOUND ABDOMEN LIMITED RIGHT UPPER QUADRANT COMPARISON:  No prior. FINDINGS: Gallbladder: Cholecystectomy. Common bile duct: Diameter: 3.0 mm. Liver: Liver has a heterogeneous echogenic parenchymal pattern. No definite focal hepatic abnormality identified however focal hepatic abnormality would be difficult to detect due to the heterogeneous echogenic parenchymal pattern. Hepatic contour is slightly irregular. Cirrhosis cannot be excluded. Portal  vein is patent on color Doppler imaging with normal direction of blood flow towards the liver. Other: None. IMPRESSION: 1. Cholecystectomy. No biliary distention. 2. Heterogeneous increased hepatic echogenicity consistent with fatty infiltration or hepatocellular disease. Hepatic contour is slightly irregular. Cirrhosis cannot be excluded. Electronically Signed   By: Maisie Fus  Register   On: 11/08/2020 10:32     Medications:   . azithromycin Stopped (11/08/20 1855)  . cefTRIAXone (ROCEPHIN)  IV 2 g (11/09/20 0852)  . sodium bicarbonate in D5W 1000 mL infusion 50 mL/hr at 11/09/20 0600   . amitriptyline  50 mg Oral QHS  . aspirin EC  81 mg Oral Daily  . carvedilol  25 mg Oral BID  . Chlorhexidine Gluconate Cloth  6 each Topical Daily  . cloNIDine  0.2 mg Oral BID  . clopidogrel  75 mg Oral Daily  . enoxaparin (LOVENOX) injection  40 mg Subcutaneous Q24H  . feeding supplement  237 mL Oral TID with meals  . insulin aspart  0-15 Units Subcutaneous TID WC  . insulin aspart  0-5 Units Subcutaneous QHS  . insulin aspart  3 Units Subcutaneous TID WC  . insulin detemir  25 Units Subcutaneous BID  . levothyroxine  25 mcg Oral Daily  . multivitamin with minerals  1 tablet Oral Daily  . nicotine  21 mg Transdermal Daily   dextrose, guaiFENesin-dextromethorphan, traMADol  Assessment/ Plan:  Tina Gregory is a 59 y.o.  female with history of diabetes mellitus, hypertension, diabetic neuropathy, CVA, hyperlipidemia who was admitted on 11/05/2020 with community-acquired pneumonia and acute kidney injury.  # Acute kidney injury with proteinuria Baseline creatinine unknown Lab Results  Component Value Date   CREATININE 4.91 (H) 11/09/2020   CREATININE 4.77 (H) 11/08/2020   CREATININE 4.52 (H) 11/08/2020   US Renal on 11/08/2020 Creatinine is slightly elevated today, but has great urine output Will continue monitoring renal function closely No acute indication for dialysis   #Metabolic  Acidosis  CO2 slightly better to 19  Continue Sodium bicarbonate infusion 50 ml/hr  #Diabetes type II with CKD Patient is on insulin Aspart and Insulin Detemir Hypoglycemic this morning, treated with D50.  Diabetes coordinator involved in the care  #Altered mental status #Community-acquired pneumonia Patient is on Azithromycin and Ceftriaxone    S 4 Krishna Dancel 11/24/202110:20 AM

## 2020-11-10 DIAGNOSIS — E1142 Type 2 diabetes mellitus with diabetic polyneuropathy: Secondary | ICD-10-CM | POA: Diagnosis not present

## 2020-11-10 DIAGNOSIS — N179 Acute kidney failure, unspecified: Secondary | ICD-10-CM | POA: Diagnosis not present

## 2020-11-10 DIAGNOSIS — I10 Essential (primary) hypertension: Secondary | ICD-10-CM | POA: Diagnosis not present

## 2020-11-10 DIAGNOSIS — J189 Pneumonia, unspecified organism: Secondary | ICD-10-CM | POA: Diagnosis not present

## 2020-11-10 LAB — GLUCOSE, CAPILLARY
Glucose-Capillary: 51 mg/dL — ABNORMAL LOW (ref 70–99)
Glucose-Capillary: 108 mg/dL — ABNORMAL HIGH (ref 70–99)
Glucose-Capillary: 173 mg/dL — ABNORMAL HIGH (ref 70–99)
Glucose-Capillary: 175 mg/dL — ABNORMAL HIGH (ref 70–99)
Glucose-Capillary: 175 mg/dL — ABNORMAL HIGH (ref 70–99)

## 2020-11-10 LAB — CBC
HCT: 28.7 % — ABNORMAL LOW (ref 36.0–46.0)
Hemoglobin: 9.6 g/dL — ABNORMAL LOW (ref 12.0–15.0)
MCH: 26.4 pg (ref 26.0–34.0)
MCHC: 33.4 g/dL (ref 30.0–36.0)
MCV: 78.8 fL — ABNORMAL LOW (ref 80.0–100.0)
Platelets: 246 10*3/uL (ref 150–400)
RBC: 3.64 MIL/uL — ABNORMAL LOW (ref 3.87–5.11)
RDW: 18.7 % — ABNORMAL HIGH (ref 11.5–15.5)
WBC: 12 10*3/uL — ABNORMAL HIGH (ref 4.0–10.5)
nRBC: 0 % (ref 0.0–0.2)

## 2020-11-10 LAB — BASIC METABOLIC PANEL
Anion gap: 13 (ref 5–15)
BUN: 49 mg/dL — ABNORMAL HIGH (ref 6–20)
CO2: 20 mmol/L — ABNORMAL LOW (ref 22–32)
Calcium: 9.1 mg/dL (ref 8.9–10.3)
Chloride: 103 mmol/L (ref 98–111)
Creatinine, Ser: 5.05 mg/dL — ABNORMAL HIGH (ref 0.44–1.00)
GFR, Estimated: 9 mL/min — ABNORMAL LOW (ref >60)
Glucose, Bld: 58 mg/dL — ABNORMAL LOW (ref 70–99)
Potassium: 3.7 mmol/L (ref 3.5–5.1)
Sodium: 136 mmol/L (ref 135–145)

## 2020-11-10 LAB — MAGNESIUM: Magnesium: 2.4 mg/dL (ref 1.7–2.4)

## 2020-11-10 MED ORDER — LOPERAMIDE HCL 2 MG PO CAPS
2.0000 mg | ORAL_CAPSULE | Freq: Once | ORAL | Status: AC
  Administered 2020-11-10: 2 mg via ORAL
  Filled 2020-11-10: qty 1

## 2020-11-10 MED ORDER — SACCHAROMYCES BOULARDII 250 MG PO CAPS
250.0000 mg | ORAL_CAPSULE | Freq: Two times a day (BID) | ORAL | Status: DC
  Administered 2020-11-10 – 2020-11-15 (×9): 250 mg via ORAL
  Filled 2020-11-10 (×12): qty 1

## 2020-11-10 MED ORDER — INSULIN DETEMIR 100 UNIT/ML ~~LOC~~ SOLN
20.0000 [IU] | Freq: Two times a day (BID) | SUBCUTANEOUS | Status: DC
  Administered 2020-11-10 – 2020-11-12 (×4): 20 [IU] via SUBCUTANEOUS
  Filled 2020-11-10 (×6): qty 0.2

## 2020-11-10 MED ORDER — INSULIN ASPART 100 UNIT/ML ~~LOC~~ SOLN
2.0000 [IU] | Freq: Three times a day (TID) | SUBCUTANEOUS | Status: DC
  Administered 2020-11-10 – 2020-11-14 (×7): 2 [IU] via SUBCUTANEOUS
  Filled 2020-11-10 (×8): qty 1

## 2020-11-10 NOTE — Progress Notes (Addendum)
PROGRESS NOTEDeasiah HagbergMcGee Gregory:[id number] DOB: [birth year] DOA: 11/05/2020 PCP: Pcp, No   LOS: 5 days   Brief narrative: As per HPI,  Tina McGeeis a 59 y.o.femalewith medical history significant ofdiabetes, hypertension, diabetic neuropathy, history of CVA, hyperlipidemia was brought in to the ED on 11/05/20 for generalized weakness and a fall while trying to get in a wheelchair in [place] parking lot.      Evaluation in the emergency department revealed a leukocytosis with low-grade fever, tachycardia tachypnea.  Her creatinine level was elevated at 2.9.  Chest x-ray showed bibasilar opacities concerning for pneumonia.  X-ray of the left knee was unremarkable.  Patient was then admitted to hospital for community-acquired pneumonia and acute kidney injury.  Assessment/Plan:  Principal Problem:   CAP (community acquired pneumonia) Active Problems:   AKI (acute kidney injury) (HCC)   Diabetes (HCC)   Essential hypertension   Hyperlipemia  Severe sepsis due to community-acquired pneumonia -present on admission.  Currently on Rocephin and Zithromax IV.  Continue supportive care.  Patient will complete 5-day course of antibiotic today.  Decreased the dose of Rocephin to 1 g daily since yesterday.    Acute urinary retention -avoid anticholinergics.  On Foley catheter.  Patient had significant urinary retention.  Due to significant creatinine elevation will continue Foley catheter for now.  Acute kidney injury with proteinuria, metabolic acidosis.  Creatinine of 5.0 today, slightly worse from yesterday.  Continue with Foley catheter placement due to rising creatinine levels..  Could be secondary to obstructive uropathy.  Renal ultrasound shows no hydronephrosis.  Patient does have chronic medical renal disease as well.  Nephrology on board.  Continue to hold Lasix and losartan and renally dose medication.  On sodium bicarb drip.  Will follow nephrology recommendations.  Continue strict  intake and output charting.  Acute metabolic encephalopathy -much improved today.  Likely multifactorial from wound infection and uremia and sedative side effects of medication.  Lyrica on hold.  Decrease the dose of Elavil yesterday..  Arterial blood gas without hypercarbia.  CT head scan was negative.  Ammonia levels were normal as well.  MRI showed chronic microvascular changes.  Patient was also seen by neurology.  Will complete Rocephin course today.    Elevated LFTs.-Unclear.  Right upper quadrant ultrasound showed increased echogenicity.  Slightly irregular.  Cirrhosis could not be excluded.  Will need outpatient follow-up.  Check CMP in a.m.  Likely OSA  -will benefit from outpatient sleep study.  ABG without hypercarbia.  Type 2 diabetes with neuropathy -patient is on Levemir 35 units BID, Novolog 20-25 units TID WC and Trulicity at home.  Continue to hold off with Lyrica.  Continue sliding scale insulin, Levemir at reduced dose, NovoLog 3 times daily.  Continue to monitor CBGs.  Patient was lightheaded mildly hypoglycemic this morning.  Hold a.m. dose of insulin.. Change Levemir to 20 units twice daily.  Essential hypertension   Coreg, clonidine, Lasix and losartan at home.  Continue to hold losartan and Lasix due to AKI.  Continue Coreg and clonidine.  Blood pressure is stable.  Hx of CVA   continue aspirin and Plavix.  MRI of the brain was negative for acute findings.   Depression / Anxiety - continue Elavil, on decreased dose since yesterday  Hypothyroidism - continue levothyroxine  Gout -hold allopurinol   Morbid obesity: Body mass index is 40.1 kg/m.    Would benefit from lifestyle modification to lose weight as outpatient.  DVT prophylaxis: Heparin subcu  Code Status:  Full code  Family Communication:  I spoke with the patient's daughter and updated her about the clinical condition of the patient.   Status is: Inpatient  Remains inpatient appropriate  because:IV treatments appropriate due to intensity of illness or inability to take PO, Inpatient level of care appropriate due to severity of illness and Likely need for skilled nursing facility placement, IV antibiotics, acute kidney injury, metabolic encephalopathy   Dispo: The patient is from: Home              Anticipated d/c is to: SNF              Anticipated d/c date is: 2 days              Patient currently is not medically stable to d/c.  Consultants:  Nephrology  Neurology   Procedures:  Foley catheter placement  Antibiotics:  . Rocephin and Zithromax 11/20>  Anti-infectives (From admission, onward)   Start     Dose/Rate Route Frequency Ordered Stop   11/10/20 1000  cefTRIAXone (ROCEPHIN) 1 g in sodium chloride 0.9 % 100 mL IVPB        1 g 200 mL/hr over 30 Minutes Intravenous Every 24 hours 11/09/20 1638 11/10/20 0847   11/08/20 1800  azithromycin (ZITHROMAX) 500 mg in sodium chloride 0.9 % 250 mL IVPB        500 mg 250 mL/hr over 60 Minutes Intravenous Every 24 hours 11/07/20 2250 11/11/20 1759   11/07/20 2200  azithromycin (ZITHROMAX) tablet 500 mg  Status:  Discontinued        500 mg Oral Daily 11/07/20 1407 11/07/20 2249   11/06/20 1000  cefTRIAXone (ROCEPHIN) 2 g in sodium chloride 0.9 % 100 mL IVPB  Status:  Discontinued        2 g 200 mL/hr over 30 Minutes Intravenous Every 24 hours 11/05/20 2311 11/09/20 1638   11/05/20 2330  azithromycin (ZITHROMAX) 500 mg in sodium chloride 0.9 % 250 mL IVPB  Status:  Discontinued        500 mg 250 mL/hr over 60 Minutes Intravenous Every 24 hours 11/05/20 2311 11/07/20 1407   11/05/20 1900  cefTRIAXone (ROCEPHIN) 1 g in sodium chloride 0.9 % 100 mL IVPB        1 g 200 mL/hr over 30 Minutes Intravenous  Once 11/05/20 1852 11/05/20 2112   11/05/20 0000  azithromycin (ZITHROMAX Z-PAK) 250 MG tablet           11/05/20 2047       Subjective: Today, patient was seen and examined at bedside.  Denies any shortness of  breath, cough, fever or chills.  Appears to be more alert awake and communicative.  Denies any nausea vomiting or abdominal pain.  Objective: Vitals:   11/10/20 0419 11/10/20 0819  BP: 140/85 124/78  Pulse: 67 66  Resp: 19 18  Temp: 98.3 F (36.8 C) 98.1 F (36.7 C)  SpO2: 96% 98%    Intake/Output Summary (Last 24 hours) at 11/10/2020 0856 Last data filed at 11/10/2020 0300 Gross per 24 hour  Intake 1268.97 ml  Output 2500 ml  Net -1231.03 ml   Filed Weights   11/05/20 1423  Weight: 109.3 kg   Body mass index is 40.1 kg/m.   Physical Exam:  GENERAL: Patient is obese, alert awake and communicative.  Not in obvious distress, on room air HENT: No scleral pallor or icterus. Pupils equally reactive to light. Oral mucosa is moist NECK: is supple, no gross  swelling noted. CHEST:   Diminished breath sounds bilaterally.  No wheezes noted. CVS: S1 and S2 heard, no murmur. Regular rate and rhythm.  ABDOMEN: Soft, non-tender, bowel sounds are present, Foley catheter in place EXTREMITIES: Trace peripheral edema CNS: Cranial nerves are intact. No focal motor deficits.  Generalized weakness noted SKIN: warm and dry without rashes.  Data Review: I have personally reviewed the following laboratory data and studies,  CBC: Recent Labs  Lab 11/06/20 0046 11/07/20 0933 11/08/20 0554 11/09/20 0545 11/10/20 0537  WBC 16.8* 12.2* 11.7* 12.5* 12.0*  NEUTROABS 12.0* 7.9*  --   --   --   HGB 9.8* 9.5* 9.2* 9.3* 9.6*  HCT 30.8* 29.8* 29.3* 29.0* 28.7*  MCV 81.7 81.9 82.3 81.9 78.8*  PLT 174 183 203 204 246   Basic Metabolic Panel: Recent Labs  Lab 11/07/20 0933 11/07/20 0933 11/08/20 0109 11/08/20 0554 11/08/20 1822 11/09/20 0545 11/10/20 0537  NA 136  --   --  137 134* 137 136  K 4.0  --   --  4.0 4.1 3.8 3.7  CL 105  --   --  106 104 106 103  CO2 19*  --   --  18* 18* 19* 20*  GLUCOSE 94   < > 100* 92 268* 57* 58*  BUN 49*  --   --  51* 49* 52* 49*  CREATININE 3.76*   --   --  4.52* 4.77* 4.91* 5.05*  CALCIUM 8.8*  --   --  8.8* 8.5* 8.8* 9.1  MG  --   --   --   --   --   --  2.4  PHOS  --   --   --   --   --  6.1*  --    < > = values in this interval not displayed.   Liver Function Tests: Recent Labs  Lab 11/06/20 0046 11/07/20 0933 11/08/20 0554 11/08/20 1822 11/09/20 0545  AST 64* 107* 93* 126*  --   ALT 53* 62* 55* 64*  --   ALKPHOS 132* 193* 205* 260*  --   BILITOT 1.1 1.3* 1.4* 1.9*  --   PROT 7.0 6.8 7.0 7.2  --   ALBUMIN 2.3* 2.0* 2.1* 1.9* 1.9*   No results for input(s): LIPASE, AMYLASE in the last 168 hours. Recent Labs  Lab 11/07/20 1425  AMMONIA 27   Cardiac Enzymes: No results for input(s): CKTOTAL, CKMB, CKMBINDEX, TROPONINI in the last 168 hours. BNP (last 3 results) No results for input(s): BNP in the last 8760 hours.  ProBNP (last 3 results) No results for input(s): PROBNP in the last 8760 hours.  CBG: Recent Labs  Lab 11/09/20 1212 11/09/20 1741 11/09/20 2156 11/09/20 2158 11/10/20 0757  GLUCAP 126* 87 66* 94 51*   Recent Results (from the past 240 hour(s))  Resp Panel by RT-PCR (Flu A&B, Covid) Nasopharyngeal Swab     Status: None   Collection Time: 11/05/20  9:36 PM   Specimen: Nasopharyngeal Swab; Nasopharyngeal(NP) swabs in vial transport medium  Result Value Ref Range Status   SARS Coronavirus 2 by RT PCR NEGATIVE NEGATIVE Final    Comment: (NOTE) SARS-CoV-2 target nucleic acids are NOT DETECTED.  The SARS-CoV-2 RNA is generally detectable in upper respiratory specimens during the acute phase of infection. The lowest concentration of SARS-CoV-2 viral copies this assay can detect is 138 copies/mL. A negative result does not preclude SARS-Cov-2 infection and should not be used as the sole basis for  treatment or other patient management decisions. A negative result may occur with  improper specimen collection/handling, submission of specimen other than nasopharyngeal swab, presence of viral  mutation(s) within the areas targeted by this assay, and inadequate number of viral copies(<138 copies/mL). A negative result must be combined with clinical observations, patient history, and epidemiological information. The expected result is Negative.  Fact Sheet for Patients:  BloggerCourse.com  Fact Sheet for Healthcare Providers:  SeriousBroker.it  This test is no t yet approved or cleared by the [place] FDA and  has been authorized for detection and/or diagnosis of SARS-CoV-2 by FDA under an Emergency Use Authorization (EUA). This EUA will remain  in effect (meaning this test can be used) for the duration of the COVID-19 declaration under Section 564(b)(1) of the Act, 21 U.S.C.section 360bbb-3(b)(1), unless the authorization is terminated  or revoked sooner.       Influenza A by PCR NEGATIVE NEGATIVE Final   Influenza B by PCR NEGATIVE NEGATIVE Final    Comment: (NOTE) The Xpert Xpress SARS-CoV-2/FLU/RSV plus assay is intended as an aid in the diagnosis of influenza from Nasopharyngeal swab specimens and should not be used as a sole basis for treatment. Nasal washings and aspirates are unacceptable for Xpert Xpress SARS-CoV-2/FLU/RSV testing.  Fact Sheet for Patients: BloggerCourse.com  Fact Sheet for Healthcare Providers: SeriousBroker.it  This test is not yet approved or cleared by the [place] FDA and has been authorized for detection and/or diagnosis of SARS-CoV-2 by FDA under an Emergency Use Authorization (EUA). This EUA will remain in effect (meaning this test can be used) for the duration of the COVID-19 declaration under Section 564(b)(1) of the Act, 21 U.S.C. section 360bbb-3(b)(1), unless the authorization is terminated or revoked.  Performed at [facility], [address] [place], [place] [address]   Culture, blood (routine x 2)  Call MD if unable to obtain prior to antibiotics being given     Status: None (Preliminary result)   Collection Time: 11/06/20 12:46 AM   Specimen: BLOOD  Result Value Ref Range Status   Specimen Description BLOOD LEFT ANTECUBITAL  Final   Special Requests   Final    BOTTLES DRAWN AEROBIC AND ANAEROBIC Blood Culture adequate volume   Culture   Final    NO GROWTH 4 DAYS Performed at [facility], [address] [place], [place] [address]    Report Status PENDING  Incomplete  Culture, blood (routine x 2) Call MD if unable to obtain prior to antibiotics being given     Status: None (Preliminary result)   Collection Time: 11/06/20 12:46 AM   Specimen: BLOOD  Result Value Ref Range Status   Specimen Description BLOOD BLOOD LEFT FOREARM  Final   Special Requests   Final    BOTTLES DRAWN AEROBIC AND ANAEROBIC Blood Culture adequate volume   Culture   Final    NO GROWTH 4 DAYS Performed at [facility], [address] [place], [place] [address]    Report Status PENDING  Incomplete  Urine Culture     Status: None   Collection Time: 11/08/20  9:11 AM   Specimen: Urine, Random  Result Value Ref Range Status   Specimen Description   Final    URINE, RANDOM Performed at [facility], [address] [place], [place] [address]    Special Requests   Final    NONE Performed at [facility], [address] [place], [place] [address]    Culture  Final    NO GROWTH Performed at [facility], [address] [place], [place] [address]    Report Status 11/09/2020 FINAL  Final     Studies: MR BRAIN W WO CONTRAST  Result Date: 11/08/2020 CLINICAL DATA:  Mental status changes. EXAM: MRI HEAD WITHOUT AND WITH CONTRAST TECHNIQUE: Multiplanar, multiecho pulse sequences of the brain and surrounding structures were obtained without and with intravenous contrast. CONTRAST:  10mL GADAVIST GADOBUTROL 1 MMOL/ML IV SOLN COMPARISON:  Head CT November 07, 2020. FINDINGS: Brain: No acute infarction, hemorrhage, hydrocephalus, extra-axial collection or mass lesion. Scattered and confluent foci of T2 hyperintensity are seen within the white matter of the cerebral hemispheres and within the pons, nonspecific, more pronounced than expected for age. No focus of abnormal contrast enhancement. Vascular: Normal flow voids. Skull and upper cervical spine: Normal marrow signal. Sinuses/Orbits: Mild mucosal thickening the bilateral ethmoid cells. The orbits are maintained. Other: Minimal bilateral mastoid effusion. IMPRESSION: 1. No acute intracranial abnormality. 2. Scattered and confluent foci of T2 hyperintensity within the white matter of the cerebral hemispheres and pons, nonspecific, more pronounced than expected for age. Differential considerations include chronic small vessel ischemic changes, vasculitis, demyelination or other infectious/inflammatory processes. Electronically Signed   By: Baldemar LenisKatyucia  [place] Rodrigues M.D.   On: 11/08/2020 15:42   ECHOCARDIOGRAM COMPLETE  Result Date: 11/09/2020    ECHOCARDIOGRAM REPORT   [facility]   [facility] Date of Exam: 11/08/2020 Medical Rec #:  [id number]   Height:       65.0 in Accession #:    [id number]  Weight:       241.0 lb Date of Birth:  12-Nov-1961  BSA:          2.141 m Patient Age:    59 years    BP:           145/78 mmHg Patient Gender: F           HR:           85 bpm. Exam Location:  [facility] Procedure: 2D Echo, Cardiac Doppler and Color Doppler Indications:     I50.9 Congestive heart failure  History:         Patient has no prior history of Echocardiogram examinations.                  Risk Factors:Hypertension, Diabetes, Dyslipidemia, Current                  Smoker and Morbid Obesity. Stroke.  Sonographer:     Sedonia SmallNaTashia Rodgers-Jones Referring Phys:  [id number] Tresa EndoKELLY A GRIFFITH Diagnosing Phys: Alwyn Peawayne D Callwood MD IMPRESSIONS  1. Left ventricular ejection fraction, by estimation, is 65 to 70%. The left ventricle  has normal function. The left ventricle has no regional wall motion abnormalities. Left ventricular diastolic parameters were normal.  2. Right ventricular systolic function is normal. The right ventricular size is normal.  3. The mitral valve is grossly normal. No evidence of mitral valve regurgitation.  4. The aortic valve is grossly normal. Aortic valve regurgitation is not visualized. FINDINGS  Left Ventricle: Left ventricular ejection fraction, by estimation, is 65 to 70%. The left ventricle has normal function. The left ventricle has no regional wall motion abnormalities. The left ventricular internal cavity size was normal in size. There is  no left ventricular hypertrophy. Left ventricular diastolic parameters were normal. Right Ventricle: The right ventricular size is normal. No increase in right ventricular wall thickness. Right ventricular  systolic function is normal. Left Atrium: Left atrial size was normal in size. Right Atrium: Right atrial size was normal in size. Pericardium: There is no evidence of pericardial effusion. Mitral Valve: The mitral valve is grossly normal. No evidence of mitral valve regurgitation. Tricuspid Valve: The tricuspid valve is normal in structure. Tricuspid valve regurgitation is trivial. Aortic Valve: The aortic valve is grossly normal. Aortic valve regurgitation is not visualized. Pulmonic Valve: The pulmonic valve was grossly normal. Pulmonic valve regurgitation is not visualized. Aorta: The ascending aorta was not well visualized. IAS/Shunts: No atrial level shunt detected by color flow Doppler.  LEFT VENTRICLE PLAX 2D LVIDd:         5.04 cm  Diastology LVIDs:         3.09 cm  LV e' medial:    6.09 cm/s LV PW:         1.02 cm  LV E/e' medial:  20.2 LV IVS:        1.02 cm  LV e' lateral:   8.70 cm/s LVOT diam:     2.10 cm  LV E/e' lateral: 14.1 LV SV:         52 LV SV Index:   24 LVOT Area:     3.46 cm  RIGHT VENTRICLE RV Basal diam:  3.65 cm RV S prime:     12.10 cm/s  TAPSE (M-mode): 2.6 cm LEFT ATRIUM             Index       RIGHT ATRIUM           Index LA diam:        3.90 cm 1.82 cm/m  RA Area:     14.70 cm LA Vol (A2C):   49.6 ml 23.16 ml/m RA Volume:   41.80 ml  19.52 ml/m LA Vol (A4C):   41.8 ml 19.52 ml/m LA Biplane Vol: 47.4 ml 22.14 ml/m  AORTIC VALVE LVOT Vmax:   91.70 cm/s LVOT Vmean:  65.800 cm/s LVOT VTI:    0.149 m  AORTA Ao Root diam: 2.90 cm MITRAL VALVE MV Area (PHT): 3.99 cm     SHUNTS MV Decel Time: 190 msec     Systemic VTI:  0.15 m MV E velocity: 123.00 cm/s  Systemic Diam: 2.10 cm MV A velocity: 74.40 cm/s MV E/A ratio:  1.65 Dwayne Salome Arnt MD Electronically signed by Alwyn Pea MD Signature Date/Time: 11/09/2020/4:10:53 PM    Final       Joycelyn Das, MD  [facility] 11/10/2020

## 2020-11-10 NOTE — Progress Notes (Signed)
[facility]  ROUNDING NOTE   Subjective:   Tina Gregory is 59 years old African-American female with history of diabetes mellitus, hypertension, diabetic neuropathy, CVA, hyperlipidemia who was admitted on 11/05/2020 with community-acquired pneumonia and acute kidney injury.  Hypoglycemic this morning. Patient more awake and alert this morning. Sitting up and eating breakfast when examined.   Objective:  Vital signs in last 24 hours:  Temp:  [98.1 F (36.7 C)-98.4 F (36.9 C)] 98.1 F (36.7 C) (11/25 1200) Pulse Rate:  [66-73] 66 (11/25 1200) Resp:  [15-20] 20 (11/25 1200) BP: (123-148)/(63-85) 123/63 (11/25 1200) SpO2:  [88 %-98 %] 88 % (11/25 1200)  Weight change:  Filed Weights   11/05/20 1423  Weight: 109.3 kg    Intake/Output: I/O last 3 completed shifts: In: 1865.2 [I.V.:1472.1; IV Piggyback:393.1] Out: 4050 [Urine:4050]   Intake/Output this shift:  No intake/output data recorded.  Physical Exam: General: in no acute distress  Head: Moist oral mucosal membranes  Eyes: Anicteric  Lungs:  Respiration even,unlabored, lungs diminished at the bases  Heart: regular  Abdomen:  Soft, nontender, obese  Extremities: No peripheral edema.  Neurologic: Alert but drowsy  Skin: No acute lesions or rashes    Basic Metabolic Panel: Recent Labs  Lab 11/07/20 0933 11/07/20 0933 11/08/20 0109 11/08/20 0554 11/08/20 0554 11/08/20 1822 11/09/20 0545 11/10/20 0537  NA 136  --   --  137  --  134* 137 136  K 4.0  --   --  4.0  --  4.1 3.8 3.7  CL 105  --   --  106  --  104 106 103  CO2 19*  --   --  18*  --  18* 19* 20*  GLUCOSE 94   < > 100* 92  --  268* 57* 58*  BUN 49*  --   --  51*  --  49* 52* 49*  CREATININE 3.76*  --   --  4.52*  --  4.77* 4.91* 5.05*  CALCIUM 8.8*   < >  --  8.8*   < > 8.5* 8.8* 9.1  MG  --   --   --   --   --   --   --  2.4  PHOS  --   --   --   --   --   --  6.1*  --    < > = values in this interval not displayed.    Liver  Function Tests: Recent Labs  Lab 11/06/20 0046 11/07/20 0933 11/08/20 0554 11/08/20 1822 11/09/20 0545  AST 64* 107* 93* 126*  --   ALT 53* 62* 55* 64*  --   ALKPHOS 132* 193* 205* 260*  --   BILITOT 1.1 1.3* 1.4* 1.9*  --   PROT 7.0 6.8 7.0 7.2  --   ALBUMIN 2.3* 2.0* 2.1* 1.9* 1.9*   No results for input(s): LIPASE, AMYLASE in the last 168 hours. Recent Labs  Lab 11/07/20 1425  AMMONIA 27    CBC: Recent Labs  Lab 11/06/20 0046 11/07/20 0933 11/08/20 0554 11/09/20 0545 11/10/20 0537  WBC 16.8* 12.2* 11.7* 12.5* 12.0*  NEUTROABS 12.0* 7.9*  --   --   --   HGB 9.8* 9.5* 9.2* 9.3* 9.6*  HCT 30.8* 29.8* 29.3* 29.0* 28.7*  MCV 81.7 81.9 82.3 81.9 78.8*  PLT 174 183 203 204 246    Cardiac Enzymes: No results for input(s): CKTOTAL, CKMB, CKMBINDEX, TROPONINI in the last 168 hours.  BNP: Invalid input(s): POCBNP  CBG: Recent Labs  Lab 11/09/20 2156 11/09/20 2158 11/10/20 0757 11/10/20 0954 11/10/20 1136  GLUCAP 66* 94 51* 108* 173*    Microbiology: Results for orders placed or performed during the hospital encounter of 11/05/20  Resp Panel by RT-PCR (Flu A&B, Covid) Nasopharyngeal Swab     Status: None   Collection Time: 11/05/20  9:36 PM   Specimen: Nasopharyngeal Swab; Nasopharyngeal(NP) swabs in vial transport medium  Result Value Ref Range Status   SARS Coronavirus 2 by RT PCR NEGATIVE NEGATIVE Final    Comment: (NOTE) SARS-CoV-2 target nucleic acids are NOT DETECTED.  The SARS-CoV-2 RNA is generally detectable in upper respiratory specimens during the acute phase of infection. The lowest concentration of SARS-CoV-2 viral copies this assay can detect is 138 copies/mL. A negative result does not preclude SARS-Cov-2 infection and should not be used as the sole basis for treatment or other patient management decisions. A negative result may occur with  improper specimen collection/handling, submission of specimen other than nasopharyngeal swab,  presence of viral mutation(s) within the areas targeted by this assay, and inadequate number of viral copies(<138 copies/mL). A negative result must be combined with clinical observations, patient history, and epidemiological information. The expected result is Negative.  Fact Sheet for Patients:  BloggerCourse.comhttps://www.fda.gov/media/152166/download  Fact Sheet for Healthcare Providers:  SeriousBroker.ithttps://www.fda.gov/media/152162/download  This test is no t yet approved or cleared by the [place] FDA and  has been authorized for detection and/or diagnosis of SARS-CoV-2 by FDA under an Emergency Use Authorization (EUA). This EUA will remain  in effect (meaning this test can be used) for the duration of the COVID-19 declaration under Section 564(b)(1) of the Act, 21 U.S.C.section 360bbb-3(b)(1), unless the authorization is terminated  or revoked sooner.       Influenza A by PCR NEGATIVE NEGATIVE Final   Influenza B by PCR NEGATIVE NEGATIVE Final    Comment: (NOTE) The Xpert Xpress SARS-CoV-2/FLU/RSV plus assay is intended as an aid in the diagnosis of influenza from Nasopharyngeal swab specimens and should not be used as a sole basis for treatment. Nasal washings and aspirates are unacceptable for Xpert Xpress SARS-CoV-2/FLU/RSV testing.  Fact Sheet for Patients: BloggerCourse.comhttps://www.fda.gov/media/152166/download  Fact Sheet for Healthcare Providers: SeriousBroker.ithttps://www.fda.gov/media/152162/download  This test is not yet approved or cleared by the [place] FDA and has been authorized for detection and/or diagnosis of SARS-CoV-2 by FDA under an Emergency Use Authorization (EUA). This EUA will remain in effect (meaning this test can be used) for the duration of the COVID-19 declaration under Section 564(b)(1) of the Act, 21 U.S.C. section 360bbb-3(b)(1), unless the authorization is terminated or revoked.  Performed at [facility], [address] [place], [place] [address]   Culture, blood  (routine x 2) Call MD if unable to obtain prior to antibiotics being given     Status: None (Preliminary result)   Collection Time: 11/06/20 12:46 AM   Specimen: BLOOD  Result Value Ref Range Status   Specimen Description BLOOD LEFT ANTECUBITAL  Final   Special Requests   Final    BOTTLES DRAWN AEROBIC AND ANAEROBIC Blood Culture adequate volume   Culture   Final    NO GROWTH 4 DAYS Performed at [facility], [address] [place], [place] [address]    Report Status PENDING  Incomplete  Culture, blood (routine x 2) Call MD if unable to obtain prior to antibiotics being given     Status: None (Preliminary result)   Collection  Time: 11/06/20 12:46 AM   Specimen: BLOOD  Result Value Ref Range Status   Specimen Description BLOOD BLOOD LEFT FOREARM  Final   Special Requests   Final    BOTTLES DRAWN AEROBIC AND ANAEROBIC Blood Culture adequate volume   Culture   Final    NO GROWTH 4 DAYS Performed at [facility], [address] [place], [place] [address]    Report Status PENDING  Incomplete  Urine Culture     Status: None   Collection Time: 11/08/20  9:11 AM   Specimen: Urine, Random  Result Value Ref Range Status   Specimen Description   Final    URINE, RANDOM Performed at [facility], [address], [place], [place] [address]    Special Requests   Final    NONE Performed at [facility], [address] [place], [place] [address]    Culture   Final    NO GROWTH Performed at [facility], [address], [place], [place] [address]    Report Status 11/09/2020 FINAL  Final    Coagulation Studies: No results for input(s): LABPROT, INR in the last 72 hours.  Urinalysis: No results for input(s): COLORURINE, LABSPEC, PHURINE, GLUCOSEU, HGBUR, BILIRUBINUR, KETONESUR, PROTEINUR, UROBILINOGEN, NITRITE, LEUKOCYTESUR in the last 72 hours.  Invalid input(s): APPERANCEUR    Imaging: MR BRAIN W WO CONTRAST  Result Date:  11/08/2020 CLINICAL DATA:  Mental status changes. EXAM: MRI HEAD WITHOUT AND WITH CONTRAST TECHNIQUE: Multiplanar, multiecho pulse sequences of the brain and surrounding structures were obtained without and with intravenous contrast. CONTRAST:  70mL GADAVIST GADOBUTROL 1 MMOL/ML IV SOLN COMPARISON:  Head CT November 07, 2020. FINDINGS: Brain: No acute infarction, hemorrhage, hydrocephalus, extra-axial collection or mass lesion. Scattered and confluent foci of T2 hyperintensity are seen within the white matter of the cerebral hemispheres and within the pons, nonspecific, more pronounced than expected for age. No focus of abnormal contrast enhancement. Vascular: Normal flow voids. Skull and upper cervical spine: Normal marrow signal. Sinuses/Orbits: Mild mucosal thickening the bilateral ethmoid cells. The orbits are maintained. Other: Minimal bilateral mastoid effusion. IMPRESSION: 1. No acute intracranial abnormality. 2. Scattered and confluent foci of T2 hyperintensity within the white matter of the cerebral hemispheres and pons, nonspecific, more pronounced than expected for age. Differential considerations include chronic small vessel ischemic changes, vasculitis, demyelination or other infectious/inflammatory processes. Electronically Signed   By: Baldemar Lenis M.D.   On: 11/08/2020 15:42   ECHOCARDIOGRAM COMPLETE  Result Date: 11/09/2020    ECHOCARDIOGRAM REPORT   Patient Name:   [facility] Date of Exam: 11/08/2020 Medical Rec #:  [id number]   Height:       65.0 in Accession #:    [id number]  Weight:       241.0 lb Date of Birth:  06-Apr-1961  BSA:          2.141 m Patient Age:    59 years    BP:           145/78 mmHg Patient Gender: F           HR:           85 bpm. Exam Location:  [facility] Procedure: 2D Echo, Cardiac Doppler and Color Doppler Indications:     I50.9 Congestive heart failure  History:         Patient has no prior history of Echocardiogram examinations.  Risk  Factors:Hypertension, Diabetes, Dyslipidemia, Current                  Smoker and Morbid Obesity. Stroke.  Sonographer:     Sedonia Small Rodgers-Jones Referring Phys:  [id number] Tresa Endo A GRIFFITH Diagnosing Phys: Alwyn Pea MD IMPRESSIONS  1. Left ventricular ejection fraction, by estimation, is 65 to 70%. The left ventricle has normal function. The left ventricle has no regional wall motion abnormalities. Left ventricular diastolic parameters were normal.  2. Right ventricular systolic function is normal. The right ventricular size is normal.  3. The mitral valve is grossly normal. No evidence of mitral valve regurgitation.  4. The aortic valve is grossly normal. Aortic valve regurgitation is not visualized. FINDINGS  Left Ventricle: Left ventricular ejection fraction, by estimation, is 65 to 70%. The left ventricle has normal function. The left ventricle has no regional wall motion abnormalities. The left ventricular internal cavity size was normal in size. There is  no left ventricular hypertrophy. Left ventricular diastolic parameters were normal. Right Ventricle: The right ventricular size is normal. No increase in right ventricular wall thickness. Right ventricular systolic function is normal. Left Atrium: Left atrial size was normal in size. Right Atrium: Right atrial size was normal in size. Pericardium: There is no evidence of pericardial effusion. Mitral Valve: The mitral valve is grossly normal. No evidence of mitral valve regurgitation. Tricuspid Valve: The tricuspid valve is normal in structure. Tricuspid valve regurgitation is trivial. Aortic Valve: The aortic valve is grossly normal. Aortic valve regurgitation is not visualized. Pulmonic Valve: The pulmonic valve was grossly normal. Pulmonic valve regurgitation is not visualized. Aorta: The ascending aorta was not well visualized. IAS/Shunts: No atrial level shunt detected by color flow Doppler.  LEFT VENTRICLE PLAX 2D LVIDd:         5.04 cm   Diastology LVIDs:         3.09 cm  LV e' medial:    6.09 cm/s LV PW:         1.02 cm  LV E/e' medial:  20.2 LV IVS:        1.02 cm  LV e' lateral:   8.70 cm/s LVOT diam:     2.10 cm  LV E/e' lateral: 14.1 LV SV:         52 LV SV Index:   24 LVOT Area:     3.46 cm  RIGHT VENTRICLE RV Basal diam:  3.65 cm RV S prime:     12.10 cm/s TAPSE (M-mode): 2.6 cm LEFT ATRIUM             Index       RIGHT ATRIUM           Index LA diam:        3.90 cm 1.82 cm/m  RA Area:     14.70 cm LA Vol (A2C):   49.6 ml 23.16 ml/m RA Volume:   41.80 ml  19.52 ml/m LA Vol (A4C):   41.8 ml 19.52 ml/m LA Biplane Vol: 47.4 ml 22.14 ml/m  AORTIC VALVE LVOT Vmax:   91.70 cm/s LVOT Vmean:  65.800 cm/s LVOT VTI:    0.149 m  AORTA Ao Root diam: 2.90 cm MITRAL VALVE MV Area (PHT): 3.99 cm     SHUNTS MV Decel Time: 190 msec     Systemic VTI:  0.15 m MV E velocity: 123.00 cm/s  Systemic Diam: 2.10 cm MV A velocity: 74.40 cm/s MV E/A ratio:  1.65 Dwayne D  Callwood MD Electronically signed by Alwyn Pea MD Signature Date/Time: 11/09/2020/4:10:53 PM    Final      Medications:   . azithromycin Stopped (11/09/20 1811)  . sodium bicarbonate in D5W 1000 mL infusion 50 mL/hr at 11/10/20 0300   . amitriptyline  10 mg Oral QHS  . aspirin EC  81 mg Oral Daily  . carvedilol  25 mg Oral BID  . Chlorhexidine Gluconate Cloth  6 each Topical Daily  . cloNIDine  0.2 mg Oral BID  . clopidogrel  75 mg Oral Daily  . feeding supplement  237 mL Oral TID with meals  . heparin injection (subcutaneous)  5,000 Units Subcutaneous Q8H  . insulin aspart  0-15 Units Subcutaneous TID WC  . insulin aspart  0-5 Units Subcutaneous QHS  . insulin aspart  3 Units Subcutaneous TID WC  . insulin detemir  25 Units Subcutaneous BID  . levothyroxine  25 mcg Oral Daily  . multivitamin with minerals  1 tablet Oral Daily  . nicotine  21 mg Transdermal Daily   dextrose, guaiFENesin-dextromethorphan, traMADol  Assessment/ Plan:  Tina Gregory is a 59  y.o.  female with history of diabetes mellitus, hypertension, diabetic neuropathy, CVA, hyperlipidemia who was admitted on 11/05/2020 with community-acquired pneumonia and acute kidney injury.  # Acute kidney injury with proteinuria Baseline creatinine unknown. Creatinine on admission of 2.96, GFR of 18.  Lab Results  Component Value Date   CREATININE 5.05 (H) 11/10/2020   CREATININE 4.91 (H) 11/09/2020   CREATININE 4.77 (H) 11/08/2020   US Renal on 11/08/2020 Nonoliguric urine output.  Holding losartan and furosemide.  Continue IV fluids No acute indication for dialysis   #Metabolic Acidosis  improving Continue Sodium bicarbonate infusion 50 ml/hr  #Diabetes type II with CKD Patient is on insulin Aspart and Insulin Detemir Hypoglycemic this morning, treated with D50.  Diabetes coordinator involved in the care  #Hypertension:  123/63 - carvedilol   S 5 Gaven Eugene 11/25/202112:40 PM

## 2020-11-11 DIAGNOSIS — I10 Essential (primary) hypertension: Secondary | ICD-10-CM | POA: Diagnosis not present

## 2020-11-11 DIAGNOSIS — E1142 Type 2 diabetes mellitus with diabetic polyneuropathy: Secondary | ICD-10-CM | POA: Diagnosis not present

## 2020-11-11 DIAGNOSIS — N179 Acute kidney failure, unspecified: Secondary | ICD-10-CM | POA: Diagnosis not present

## 2020-11-11 DIAGNOSIS — J189 Pneumonia, unspecified organism: Secondary | ICD-10-CM | POA: Diagnosis not present

## 2020-11-11 LAB — CULTURE, BLOOD (ROUTINE X 2)
Culture: NO GROWTH
Culture: NO GROWTH
Special Requests: ADEQUATE
Special Requests: ADEQUATE

## 2020-11-11 LAB — COMPREHENSIVE METABOLIC PANEL
ALT: 65 U/L — ABNORMAL HIGH (ref 0–44)
AST: 103 U/L — ABNORMAL HIGH (ref 15–41)
Albumin: 1.8 g/dL — ABNORMAL LOW (ref 3.5–5.0)
Alkaline Phosphatase: 372 U/L — ABNORMAL HIGH (ref 38–126)
Anion gap: 12 (ref 5–15)
BUN: 47 mg/dL — ABNORMAL HIGH (ref 6–20)
CO2: 21 mmol/L — ABNORMAL LOW (ref 22–32)
Calcium: 8.8 mg/dL — ABNORMAL LOW (ref 8.9–10.3)
Chloride: 105 mmol/L (ref 98–111)
Creatinine, Ser: 4.81 mg/dL — ABNORMAL HIGH (ref 0.44–1.00)
GFR, Estimated: 10 mL/min — ABNORMAL LOW (ref >60)
Glucose, Bld: 210 mg/dL — ABNORMAL HIGH (ref 70–99)
Potassium: 3.7 mmol/L (ref 3.5–5.1)
Sodium: 138 mmol/L (ref 135–145)
Total Bilirubin: 1.2 mg/dL (ref 0.3–1.2)
Total Protein: 7.3 g/dL (ref 6.5–8.1)

## 2020-11-11 LAB — GLUCOSE, CAPILLARY
Glucose-Capillary: 171 mg/dL — ABNORMAL HIGH (ref 70–99)
Glucose-Capillary: 131 mg/dL — ABNORMAL HIGH (ref 70–99)
Glucose-Capillary: 83 mg/dL (ref 70–99)
Glucose-Capillary: 179 mg/dL — ABNORMAL HIGH (ref 70–99)

## 2020-11-11 LAB — MAGNESIUM: Magnesium: 2.2 mg/dL (ref 1.7–2.4)

## 2020-11-11 LAB — CBC
HCT: 28.5 % — ABNORMAL LOW (ref 36.0–46.0)
Hemoglobin: 9.2 g/dL — ABNORMAL LOW (ref 12.0–15.0)
MCH: 25.8 pg — ABNORMAL LOW (ref 26.0–34.0)
MCHC: 32.3 g/dL (ref 30.0–36.0)
MCV: 79.8 fL — ABNORMAL LOW (ref 80.0–100.0)
Platelets: 272 10*3/uL (ref 150–400)
RBC: 3.57 MIL/uL — ABNORMAL LOW (ref 3.87–5.11)
RDW: 18.6 % — ABNORMAL HIGH (ref 11.5–15.5)
WBC: 10.7 10*3/uL — ABNORMAL HIGH (ref 4.0–10.5)
nRBC: 0 % (ref 0.0–0.2)

## 2020-11-11 MED ORDER — LOPERAMIDE HCL 2 MG PO CAPS: 2.0000 mg | ORAL_CAPSULE | ORAL | Status: DC | PRN

## 2020-11-11 MED ORDER — ACETAMINOPHEN 325 MG PO TABS
650.0000 mg | ORAL_TABLET | Freq: Four times a day (QID) | ORAL | Status: DC | PRN
  Administered 2020-11-11: 650 mg via ORAL
  Filled 2020-11-11: qty 2

## 2020-11-11 NOTE — Consult Note (Signed)
WOC Nurse Consult Note: Reason for Consult: pressure injury Staff have been documenting since admission "boil"  Per patient and family this was present on admission and they were calling it a boil Has started to drain Wound type: unclear etiology; further workup needed; general surgery evaluation Pressure Injury POA:NA Measurement: bulbous like area with several areas of necrotic tissue; aprox 6cm x 4cm x 0cm Wound bed: see above  Drainage (amount, consistency, odor) yellow, no odor Periwound:superficial skin crusting along perimeter of area  Dressing procedure/placement/frequency: Unclear etiology, does not present like pressure injury.  Topical care with non adherent.  Consider general surgery evaluation or wound care center follow up at DC.  Discussed POC with patient and bedside nurse.  Re consult if needed, will not follow at this time. Thanks  Jude Naclerio [employer], RN,CWOCN, CNS, CWON-AP [phone])

## 2020-11-11 NOTE — Progress Notes (Addendum)
PROGRESS NOTE  Tina Gregory LXB:[id number] DOB: 07-12-1961 DOA: 11/05/2020 PCP: Pcp, No   LOS: 6 days   Brief narrative: As per HPI,  Tina McGeeis a 59 y.o.femalewith medical history significant ofdiabetes, hypertension, diabetic neuropathy, history of CVA, hyperlipidemia was brought in to the ED on 11/05/20 for generalized weakness and a fall while trying to get in a wheelchair in [place] parking lot.      Evaluation in the emergency department revealed a leukocytosis with low-grade fever, tachycardia tachypnea.  Her creatinine level was elevated at 2.9.  Chest x-ray showed bibasilar opacities concerning for pneumonia.  X-ray of the left knee was unremarkable.  Patient was then admitted to hospital for community-acquired pneumonia and acute kidney injury.    Patient did have urinary retention and required Foley catheter.  Nephrology was consulted during hospitalization.  Patient still has significantly elevated creatinine and acute kidney injury.  Assessment/Plan:  Principal Problem:   CAP (community acquired pneumonia) Active Problems:   AKI (acute kidney injury) (HCC)   Diabetes (HCC)   Essential hypertension   Hyperlipemia  Severe sepsis due to community-acquired pneumonia -completed 5-day course of antibiotic with Rocephin and Zithromax.  Currently on room air.  Acute urinary retention -with acute kidney injury continue Foley catheter for now.  Acute kidney injury with proteinuria, with metabolic acidosis.  Creatinine significantly elevated.  Slightly decreased trend today.  Urology on board.  Continue Foley catheter for accurate intake and output charting and for urinary retention.  Renal ultrasound without hydronephrosis.  Nephrology on board.  Continue to hold Lasix and losartan.  Currently on sodium bicarb drip.   Continue strict intake and output charting.  Acute metabolic encephalopathy -likely multifactorial from sedatives, infection and renal failure.  Improved at  this time.  Minimize sedatives.  Arterial blood gas without hypercarbia.  CT head scan was negative.  Ammonia levels were normal as well.  MRI showed chronic microvascular changes.  Has been treated pneumonia.  Elevated LFTs.-Unclear.  Right upper quadrant ultrasound showed increased echogenicity.  Slightly irregular.  Cirrhosis could not be excluded.  Will need outpatient follow-up for LFTs.   Likely OSA  -will benefit from outpatient sleep study.  ABG without hypercarbia.  Type 2 diabetes with neuropathy -patient is on Levemir 35 units BID, Novolog 20-25 units TID WC and Trulicity at home.  Due to underlying acute renal failure patient had episodes of hypotension.  Decreased dose of pain and sliding scale insulin at this time.    Essential hypertension   Coreg, clonidine, Lasix and losartan at home.  Continue to hold losartan and Lasix due to AKI.  Continue Coreg and clonidine.  Blood pressure is marginally elevated  Hx of CVA   continue aspirin and Plavix.  MRI of the brain was negative for acute findings.   Depression / Anxiety - continue Elavil but at a decreased dose.  Hypothyroidism - continue levothyroxine  Gout -hold allopurinol due to acute kidney injury  Morbid obesity: Body mass index is 40.1 kg/m.    Would benefit from lifestyle modification to lose weight as outpatient.  DVT prophylaxis: Heparin subcu  Lesion in the buttocks area..  Consult wound care.   Code Status: Full code  Family Communication: None today.  I spoke with the patient's daughter and updated her about the clinical condition of the patient yesterday.   Status is: Inpatient  Remains inpatient appropriate because:IV treatments appropriate due to intensity of illness or inability to take PO, Inpatient level of care appropriate due to  severity of illness and Likely need for skilled nursing facility placement, IV antibiotics, acute kidney injury, metabolic encephalopathy   Dispo: The patient is  from: Home              Anticipated d/c is to: SNF as per physical therapy recommendations.              Anticipated d/c date is: 2 days              Patient currently is not medically stable to d/c.  Consultants:  Nephrology  Neurology   Procedures:  Foley catheter placement  Antibiotics:  . Rocephin and Zithromax 11/20>11/25  Subjective:  Today, states that she has been having some diarrhea.  Complains of some ball around the rectal area which was painful.   Objective: Vitals:   11/10/20 2344 11/11/20 0438  BP: (!) 166/88 (!) 147/83  Pulse: 72 70  Resp: 16 14  Temp: 98.4 F (36.9 C) 98.9 F (37.2 C)  SpO2: 90% 90%    Intake/Output Summary (Last 24 hours) at 11/11/2020 0725 Last data filed at 11/11/2020 0332 Gross per 24 hour  Intake 633.4 ml  Output 2050 ml  Net -1416.6 ml   Filed Weights   11/05/20 1423  Weight: 109.3 kg   Body mass index is 40.1 kg/m.   Physical Exam:  General: Obese built, not in obvious distress HENT:   No scleral pallor or icterus noted. Oral mucosa is moist.  Chest: Decreased breath sounds bilaterally.  CVS: S1 &S2 heard. No murmur.  Regular rate and rhythm. Abdomen: Soft, nontender, nondistended.  Bowel sounds are heard.  Foley catheter.  Rectal area examined with lesion around the perianal area Extremities: No cyanosis, clubbing, trace peripheral edema peripheral pulses are palpable. Psych: Alert, awake and oriented, normal mood CNS:  No cranial nerve deficits.  Power equal in all extremities.  Generalized weakness noted Skin: Warm and dry.  Lesion around perianal area   Data Review: I have personally reviewed the following laboratory data and studies,  CBC: Recent Labs  Lab 11/06/20 0046 11/06/20 0046 11/07/20 0933 11/08/20 0554 11/09/20 0545 11/10/20 0537 11/11/20 0542  WBC 16.8*   < > 12.2* 11.7* 12.5* 12.0* 10.7*  NEUTROABS 12.0*  --  7.9*  --   --   --   --   HGB 9.8*   < > 9.5* 9.2* 9.3* 9.6* 9.2*  HCT  30.8*   < > 29.8* 29.3* 29.0* 28.7* 28.5*  MCV 81.7   < > 81.9 82.3 81.9 78.8* 79.8*  PLT 174   < > 183 203 204 246 272   < > = values in this interval not displayed.   Basic Metabolic Panel: Recent Labs  Lab 11/08/20 0554 11/08/20 1822 11/09/20 0545 11/10/20 0537 11/11/20 0542  NA 137 134* 137 136 138  K 4.0 4.1 3.8 3.7 3.7  CL 106 104 106 103 105  CO2 18* 18* 19* 20* 21*  GLUCOSE 92 268* 57* 58* 210*  BUN 51* 49* 52* 49* 47*  CREATININE 4.52* 4.77* 4.91* 5.05* 4.81*  CALCIUM 8.8* 8.5* 8.8* 9.1 8.8*  MG  --   --   --  2.4 2.2  PHOS  --   --  6.1*  --   --    Liver Function Tests: Recent Labs  Lab 11/06/20 0046 11/06/20 0046 11/07/20 0933 11/08/20 0554 11/08/20 1822 11/09/20 0545 11/11/20 0542  AST 64*  --  107* 93* 126*  --  103*  ALT  53*  --  62* 55* 64*  --  65*  ALKPHOS 132*  --  193* 205* 260*  --  372*  BILITOT 1.1  --  1.3* 1.4* 1.9*  --  1.2  PROT 7.0  --  6.8 7.0 7.2  --  7.3  ALBUMIN 2.3*   < > 2.0* 2.1* 1.9* 1.9* 1.8*   < > = values in this interval not displayed.   No results for input(s): LIPASE, AMYLASE in the last 168 hours. Recent Labs  Lab 11/07/20 1425  AMMONIA 27   Cardiac Enzymes: No results for input(s): CKTOTAL, CKMB, CKMBINDEX, TROPONINI in the last 168 hours. BNP (last 3 results) No results for input(s): BNP in the last 8760 hours.  ProBNP (last 3 results) No results for input(s): PROBNP in the last 8760 hours.  CBG: Recent Labs  Lab 11/10/20 0757 11/10/20 0954 11/10/20 1136 11/10/20 1620 11/10/20 2106  GLUCAP 51* 108* 173* 175* 175*   Recent Results (from the past 240 hour(s))  Resp Panel by RT-PCR (Flu A&B, Covid) Nasopharyngeal Swab     Status: None   Collection Time: 11/05/20  9:36 PM   Specimen: Nasopharyngeal Swab; Nasopharyngeal(NP) swabs in vial transport medium  Result Value Ref Range Status   SARS Coronavirus 2 by RT PCR NEGATIVE NEGATIVE Final    Comment: (NOTE) SARS-CoV-2 target nucleic acids are NOT  DETECTED.  The SARS-CoV-2 RNA is generally detectable in upper respiratory specimens during the acute phase of infection. The lowest concentration of SARS-CoV-2 viral copies this assay can detect is 138 copies/mL. A negative result does not preclude SARS-Cov-2 infection and should not be used as the sole basis for treatment or other patient management decisions. A negative result may occur with  improper specimen collection/handling, submission of specimen other than nasopharyngeal swab, presence of viral mutation(s) within the areas targeted by this assay, and inadequate number of viral copies(<138 copies/mL). A negative result must be combined with clinical observations, patient history, and epidemiological information. The expected result is Negative.  Fact Sheet for Patients:  BloggerCourse.com  Fact Sheet for Healthcare Providers:  SeriousBroker.it  This test is no t yet approved or cleared by the [place] FDA and  has been authorized for detection and/or diagnosis of SARS-CoV-2 by FDA under an Emergency Use Authorization (EUA). This EUA will remain  in effect (meaning this test can be used) for the duration of the COVID-19 declaration under Section 564(b)(1) of the Act, 21 U.S.C.section 360bbb-3(b)(1), unless the authorization is terminated  or revoked sooner.       Influenza A by PCR NEGATIVE NEGATIVE Final   Influenza B by PCR NEGATIVE NEGATIVE Final    Comment: (NOTE) The Xpert Xpress SARS-CoV-2/FLU/RSV plus assay is intended as an aid in the diagnosis of influenza from Nasopharyngeal swab specimens and should not be used as a sole basis for treatment. Nasal washings and aspirates are unacceptable for Xpert Xpress SARS-CoV-2/FLU/RSV testing.  Fact Sheet for Patients: BloggerCourse.com  Fact Sheet for Healthcare Providers: SeriousBroker.it  This test is not yet  approved or cleared by the [place] FDA and has been authorized for detection and/or diagnosis of SARS-CoV-2 by FDA under an Emergency Use Authorization (EUA). This EUA will remain in effect (meaning this test can be used) for the duration of the COVID-19 declaration under Section 564(b)(1) of the Act, 21 U.S.C. section 360bbb-3(b)(1), unless the authorization is terminated or revoked.  Performed at [facility], [address], [place], [place] [address]  Culture, blood (routine x 2) Call MD if unable to obtain prior to antibiotics being given     Status: None   Collection Time: 11/06/20 12:46 AM   Specimen: BLOOD  Result Value Ref Range Status   Specimen Description BLOOD LEFT ANTECUBITAL  Final   Special Requests   Final    BOTTLES DRAWN AEROBIC AND ANAEROBIC Blood Culture adequate volume   Culture   Final    NO GROWTH 5 DAYS Performed at [facility], [address], [place], [place] [address]    Report Status 11/11/2020 FINAL  Final  Culture, blood (routine x 2) Call MD if unable to obtain prior to antibiotics being given     Status: None   Collection Time: 11/06/20 12:46 AM   Specimen: BLOOD  Result Value Ref Range Status   Specimen Description BLOOD BLOOD LEFT FOREARM  Final   Special Requests   Final    BOTTLES DRAWN AEROBIC AND ANAEROBIC Blood Culture adequate volume   Culture   Final    NO GROWTH 5 DAYS Performed at [facility], [address] [place], [place] [address]    Report Status 11/11/2020 FINAL  Final  Urine Culture     Status: None   Collection Time: 11/08/20  9:11 AM   Specimen: Urine, Random  Result Value Ref Range Status   Specimen Description   Final    URINE, RANDOM Performed at [facility], [address] [place], [place] [address]    Special Requests   Final    NONE Performed at [facility], [address], [place], [place] [address]    Culture   Final    NO GROWTH Performed at  [facility], [address], [place], [place] [address]    Report Status 11/09/2020 FINAL  Final     Studies: No results found.    Joycelyn Das, MD  [facility] 11/11/2020

## 2020-11-11 NOTE — Progress Notes (Signed)
[facility]  ROUNDING NOTE   Subjective:   Tina Gregory is 59 years old African-American female with history of diabetes mellitus, hypertension, diabetic neuropathy, CVA, hyperlipidemia who was admitted on 11/05/2020 with community-acquired pneumonia and acute kidney injury.  Patient awake,sitting up, consuming her breakfast,denies nausea, vomiting or SOB.  Objective:  Vital signs in last 24 hours:  Temp:  [97.9 F (36.6 C)-98.9 F (37.2 C)] 98 F (36.7 C) (11/26 0940) Pulse Rate:  [64-72] 67 (11/26 0940) Resp:  [14-20] 18 (11/26 0940) BP: (123-166)/(63-88) 158/87 (11/26 0940) SpO2:  [90 %-100 %] 100 % (11/26 0940)  Weight change:  Filed Weights   11/05/20 1423  Weight: 109.3 kg    Intake/Output: I/O last 3 completed shifts: In: 1258.3 [I.V.:1038.9; IV Piggyback:219.4] Out: 4550 [Urine:4550]   Intake/Output this shift:  No intake/output data recorded.  Physical Exam: General: Sitting up in bed, appears pleasant and comfortable  Head: Normocephalic,atraumatic  Eyes: Sclerae and conjunctivae clear  Lungs:  Lungs clear but diminished at the bases,respirations even,unlabored  Heart: S1S2,no rubs or gallops  Abdomen:  Soft, nontender, obese  Extremities: No peripheral edema.  Neurologic: Alert,awake, speech clear and appropriate  Skin: No acute lesions or rashes    Basic Metabolic Panel: Recent Labs  Lab 11/08/20 0554 11/08/20 0554 11/08/20 1822 11/08/20 1822 11/09/20 0545 11/10/20 0537 11/11/20 0542  NA 137  --  134*  --  137 136 138  K 4.0  --  4.1  --  3.8 3.7 3.7  CL 106  --  104  --  106 103 105  CO2 18*  --  18*  --  19* 20* 21*  GLUCOSE 92  --  268*  --  57* 58* 210*  BUN 51*  --  49*  --  52* 49* 47*  CREATININE 4.52*  --  4.77*  --  4.91* 5.05* 4.81*  CALCIUM 8.8*   < > 8.5*   < > 8.8* 9.1 8.8*  MG  --   --   --   --   --  2.4 2.2  PHOS  --   --   --   --  6.1*  --   --    < > = values in this interval not displayed.    Liver  Function Tests: Recent Labs  Lab 11/06/20 0046 11/06/20 0046 11/07/20 0933 11/08/20 0554 11/08/20 1822 11/09/20 0545 11/11/20 0542  AST 64*  --  107* 93* 126*  --  103*  ALT 53*  --  62* 55* 64*  --  65*  ALKPHOS 132*  --  193* 205* 260*  --  372*  BILITOT 1.1  --  1.3* 1.4* 1.9*  --  1.2  PROT 7.0  --  6.8 7.0 7.2  --  7.3  ALBUMIN 2.3*   < > 2.0* 2.1* 1.9* 1.9* 1.8*   < > = values in this interval not displayed.   No results for input(s): LIPASE, AMYLASE in the last 168 hours. Recent Labs  Lab 11/07/20 1425  AMMONIA 27    CBC: Recent Labs  Lab 11/06/20 0046 11/06/20 0046 11/07/20 0933 11/08/20 0554 11/09/20 0545 11/10/20 0537 11/11/20 0542  WBC 16.8*   < > 12.2* 11.7* 12.5* 12.0* 10.7*  NEUTROABS 12.0*  --  7.9*  --   --   --   --   HGB 9.8*   < > 9.5* 9.2* 9.3* 9.6* 9.2*  HCT 30.8*   < > 29.8* 29.3* 29.0* 28.7*  28.5*  MCV 81.7   < > 81.9 82.3 81.9 78.8* 79.8*  PLT 174   < > 183 203 204 246 272   < > = values in this interval not displayed.    Cardiac Enzymes: No results for input(s): CKTOTAL, CKMB, CKMBINDEX, TROPONINI in the last 168 hours.  BNP: Invalid input(s): POCBNP  CBG: Recent Labs  Lab 11/10/20 0954 11/10/20 1136 11/10/20 1620 11/10/20 2106 11/11/20 0935  GLUCAP 108* 173* 175* 175* 171*    Microbiology: Results for orders placed or performed during the hospital encounter of 11/05/20  Resp Panel by RT-PCR (Flu A&B, Covid) Nasopharyngeal Swab     Status: None   Collection Time: 11/05/20  9:36 PM   Specimen: Nasopharyngeal Swab; Nasopharyngeal(NP) swabs in vial transport medium  Result Value Ref Range Status   SARS Coronavirus 2 by RT PCR NEGATIVE NEGATIVE Final    Comment: (NOTE) SARS-CoV-2 target nucleic acids are NOT DETECTED.  The SARS-CoV-2 RNA is generally detectable in upper respiratory specimens during the acute phase of infection. The lowest concentration of SARS-CoV-2 viral copies this assay can detect is 138 copies/mL. A  negative result does not preclude SARS-Cov-2 infection and should not be used as the sole basis for treatment or other patient management decisions. A negative result may occur with  improper specimen collection/handling, submission of specimen other than nasopharyngeal swab, presence of viral mutation(s) within the areas targeted by this assay, and inadequate number of viral copies(<138 copies/mL). A negative result must be combined with clinical observations, patient history, and epidemiological information. The expected result is Negative.  Fact Sheet for Patients:  BloggerCourse.com  Fact Sheet for Healthcare Providers:  SeriousBroker.it  This test is no t yet approved or cleared by the [place] FDA and  has been authorized for detection and/or diagnosis of SARS-CoV-2 by FDA under an Emergency Use Authorization (EUA). This EUA will remain  in effect (meaning this test can be used) for the duration of the COVID-19 declaration under Section 564(b)(1) of the Act, 21 U.S.C.section 360bbb-3(b)(1), unless the authorization is terminated  or revoked sooner.       Influenza A by PCR NEGATIVE NEGATIVE Final   Influenza B by PCR NEGATIVE NEGATIVE Final    Comment: (NOTE) The Xpert Xpress SARS-CoV-2/FLU/RSV plus assay is intended as an aid in the diagnosis of influenza from Nasopharyngeal swab specimens and should not be used as a sole basis for treatment. Nasal washings and aspirates are unacceptable for Xpert Xpress SARS-CoV-2/FLU/RSV testing.  Fact Sheet for Patients: BloggerCourse.com  Fact Sheet for Healthcare Providers: SeriousBroker.it  This test is not yet approved or cleared by the [place] FDA and has been authorized for detection and/or diagnosis of SARS-CoV-2 by FDA under an Emergency Use Authorization (EUA). This EUA will remain in effect (meaning this test can  be used) for the duration of the COVID-19 declaration under Section 564(b)(1) of the Act, 21 U.S.C. section 360bbb-3(b)(1), unless the authorization is terminated or revoked.  Performed at [facility], [address] [place], [place] [address]   Culture, blood (routine x 2) Call MD if unable to obtain prior to antibiotics being given     Status: None   Collection Time: 11/06/20 12:46 AM   Specimen: BLOOD  Result Value Ref Range Status   Specimen Description BLOOD LEFT ANTECUBITAL  Final   Special Requests   Final    BOTTLES DRAWN AEROBIC AND ANAEROBIC Blood Culture adequate volume   Culture   Final  NO GROWTH 5 DAYS Performed at [facility], [address] [place], [place] [address]    Report Status 11/11/2020 FINAL  Final  Culture, blood (routine x 2) Call MD if unable to obtain prior to antibiotics being given     Status: None   Collection Time: 11/06/20 12:46 AM   Specimen: BLOOD  Result Value Ref Range Status   Specimen Description BLOOD BLOOD LEFT FOREARM  Final   Special Requests   Final    BOTTLES DRAWN AEROBIC AND ANAEROBIC Blood Culture adequate volume   Culture   Final    NO GROWTH 5 DAYS Performed at [facility], [address] [place], [place] [address]    Report Status 11/11/2020 FINAL  Final  Urine Culture     Status: None   Collection Time: 11/08/20  9:11 AM   Specimen: Urine, Random  Result Value Ref Range Status   Specimen Description   Final    URINE, RANDOM Performed at [facility], [address] [place], [place] [address]    Special Requests   Final    NONE Performed at [facility], [address], [place], [place] [address]    Culture   Final    NO GROWTH Performed at [facility], [address] [place], [place] [address]    Report Status 11/09/2020 FINAL  Final    Coagulation Studies: No results for input(s): LABPROT, INR in the last 72 hours.  Urinalysis: No results for  input(s): COLORURINE, LABSPEC, PHURINE, GLUCOSEU, HGBUR, BILIRUBINUR, KETONESUR, PROTEINUR, UROBILINOGEN, NITRITE, LEUKOCYTESUR in the last 72 hours.  Invalid input(s): APPERANCEUR    Imaging: No results found.   Medications:   . sodium bicarbonate in D5W 1000 mL infusion 50 mL/hr at 11/10/20 2219   . amitriptyline  10 mg Oral QHS  . aspirin EC  81 mg Oral Daily  . carvedilol  25 mg Oral BID  . Chlorhexidine Gluconate Cloth  6 each Topical Daily  . cloNIDine  0.2 mg Oral BID  . clopidogrel  75 mg Oral Daily  . feeding supplement  237 mL Oral TID with meals  . heparin injection (subcutaneous)  5,000 Units Subcutaneous Q8H  . insulin aspart  0-15 Units Subcutaneous TID WC  . insulin aspart  0-5 Units Subcutaneous QHS  . insulin aspart  2 Units Subcutaneous TID WC  . insulin detemir  20 Units Subcutaneous BID  . levothyroxine  25 mcg Oral Daily  . multivitamin with minerals  1 tablet Oral Daily  . nicotine  21 mg Transdermal Daily  . saccharomyces boulardii  250 mg Oral BID   acetaminophen, dextrose, guaiFENesin-dextromethorphan, loperamide, traMADol  Assessment/ Plan:  Tina Gregory is a 59 y.o.  female with history of diabetes mellitus, hypertension, diabetic neuropathy, CVA, hyperlipidemia who was admitted on 11/05/2020 with community-acquired pneumonia and acute kidney injury.  # Acute kidney injury with proteinuria Baseline creatinine unknown. Creatinine on admission of 2.96, GFR of 18.  Lab Results  Component Value Date   CREATININE 4.81 (H) 11/11/2020   CREATININE 5.05 (H) 11/10/2020   CREATININE 4.91 (H) 11/09/2020   Urine output 2050 ml for the preceding 24 hours No acute indication for dialysis Will continue IVF 50 ml/hr Will continue monitoring renal function daily  #Metabolic Acidosis  Improved to 21 Continue Sodium bicarbonate infusion 50 ml/hr  #Diabetes type II with CKD Lab Results  Component Value Date   HGBA1C 10.8 (H) 11/06/2020  Intermittent  hypoglycemic episodes  during this admission Blood glucose this morning is 210 Patient reports better oral intake now.  #Hypertension Blood pressure slightly above the goal today Patient is on clonidine and Carvedilol   [address] 11/26/202110:39 AM

## 2020-11-11 NOTE — Progress Notes (Signed)
Assessed area of right buttock cheek on 11/10/2020. Area is draining serosanguinous fluid. Patient does complain of some tenderness in that area. Cleansed with water and patted dry. Applied foam border dressing. Notified night nurse of wound. She stated that patient had several bowel movements overnight with liquid consistency and she removed the dressing due to the drainage and stated that she thought it needed to drain. Assessed area this morning and it is draining without dressing covering. Wound care consult is in place. Patient is resting on her left side. Patient stated yesterday, along with daughter that she had the wound on her buttock prior to admission. She stated that it is a boil and has had it for several days now. Bed in low position and call bell in reach.

## 2020-11-12 DIAGNOSIS — E1142 Type 2 diabetes mellitus with diabetic polyneuropathy: Secondary | ICD-10-CM | POA: Diagnosis not present

## 2020-11-12 DIAGNOSIS — I10 Essential (primary) hypertension: Secondary | ICD-10-CM | POA: Diagnosis not present

## 2020-11-12 DIAGNOSIS — L0231 Cutaneous abscess of buttock: Secondary | ICD-10-CM

## 2020-11-12 DIAGNOSIS — J189 Pneumonia, unspecified organism: Secondary | ICD-10-CM | POA: Diagnosis not present

## 2020-11-12 DIAGNOSIS — N179 Acute kidney failure, unspecified: Secondary | ICD-10-CM | POA: Diagnosis not present

## 2020-11-12 LAB — BASIC METABOLIC PANEL
Anion gap: 10 (ref 5–15)
BUN: 43 mg/dL — ABNORMAL HIGH (ref 6–20)
CO2: 22 mmol/L (ref 22–32)
Calcium: 8.9 mg/dL (ref 8.9–10.3)
Chloride: 106 mmol/L (ref 98–111)
Creatinine, Ser: 4.42 mg/dL — ABNORMAL HIGH (ref 0.44–1.00)
GFR, Estimated: 11 mL/min — ABNORMAL LOW (ref >60)
Glucose, Bld: 64 mg/dL — ABNORMAL LOW (ref 70–99)
Potassium: 3.4 mmol/L — ABNORMAL LOW (ref 3.5–5.1)
Sodium: 138 mmol/L (ref 135–145)

## 2020-11-12 LAB — CBC
HCT: 29.5 % — ABNORMAL LOW (ref 36.0–46.0)
Hemoglobin: 9.6 g/dL — ABNORMAL LOW (ref 12.0–15.0)
MCH: 25.9 pg — ABNORMAL LOW (ref 26.0–34.0)
MCHC: 32.5 g/dL (ref 30.0–36.0)
MCV: 79.7 fL — ABNORMAL LOW (ref 80.0–100.0)
Platelets: 301 10*3/uL (ref 150–400)
RBC: 3.7 MIL/uL — ABNORMAL LOW (ref 3.87–5.11)
RDW: 18.8 % — ABNORMAL HIGH (ref 11.5–15.5)
WBC: 10.7 10*3/uL — ABNORMAL HIGH (ref 4.0–10.5)
nRBC: 0 % (ref 0.0–0.2)

## 2020-11-12 LAB — GLUCOSE, CAPILLARY
Glucose-Capillary: 72 mg/dL (ref 70–99)
Glucose-Capillary: 117 mg/dL — ABNORMAL HIGH (ref 70–99)
Glucose-Capillary: 169 mg/dL — ABNORMAL HIGH (ref 70–99)
Glucose-Capillary: 247 mg/dL — ABNORMAL HIGH (ref 70–99)

## 2020-11-12 MED ORDER — HYDRALAZINE HCL 10 MG PO TABS
10.0000 mg | ORAL_TABLET | Freq: Three times a day (TID) | ORAL | Status: DC
  Administered 2020-11-12 (×3): 10 mg via ORAL
  Filled 2020-11-12 (×5): qty 1

## 2020-11-12 MED ORDER — INSULIN DETEMIR 100 UNIT/ML ~~LOC~~ SOLN
15.0000 [IU] | Freq: Two times a day (BID) | SUBCUTANEOUS | Status: DC
  Administered 2020-11-12 – 2020-11-15 (×6): 15 [IU] via SUBCUTANEOUS
  Filled 2020-11-12 (×7): qty 0.15

## 2020-11-12 MED ORDER — SODIUM CHLORIDE 0.9 % IV SOLN
3.0000 g | Freq: Two times a day (BID) | INTRAVENOUS | Status: DC
  Administered 2020-11-12 – 2020-11-15 (×7): 3 g via INTRAVENOUS
  Filled 2020-11-12 (×3): qty 8
  Filled 2020-11-12: qty 0.86
  Filled 2020-11-12: qty 8
  Filled 2020-11-12: qty 0.86
  Filled 2020-11-12: qty 8
  Filled 2020-11-12: qty 0.86

## 2020-11-12 MED ORDER — POTASSIUM CHLORIDE CRYS ER 20 MEQ PO TBCR
40.0000 meq | EXTENDED_RELEASE_TABLET | Freq: Once | ORAL | Status: AC
  Administered 2020-11-12: 40 meq via ORAL
  Filled 2020-11-12: qty 2

## 2020-11-12 NOTE — Consult Note (Signed)
Pharmacy Antibiotic Note  Tina Gregory is a 59 y.o. female admitted on 11/05/2020 with rt buttock abscess cellulitis.  Pharmacy has been consulted for Unasyn dosing.  Plan: Will initiate Unasyn 3g q12h per current renal function (<30ml/min)  Height: 5\' 5"  (165.1 cm) Weight: 109.3 kg (241 lb) IBW/kg (Calculated) : 57  Temp (24hrs), Avg:98.2 F (36.8 C), Min:97.6 F (36.4 C), Max:98.9 F (37.2 C)  Recent Labs  Lab 11/08/20 0554 11/08/20 0554 11/08/20 1822 11/09/20 0545 11/10/20 0537 11/11/20 0542 11/12/20 0507  WBC 11.7*  --   --  12.5* 12.0* 10.7* 10.7*  CREATININE 4.52*   < > 4.77* 4.91* 5.05* 4.81* 4.42*   < > = values in this interval not displayed.    Estimated Creatinine Clearance: 16.9 mL/min (A) (by C-G formula based on SCr of 4.42 mg/dL (H)).    Allergies  Allergen Reactions  . Lisinopril     Headache, shaking  . Shellfish Allergy Hives    Antimicrobials this admission: Ongoing Unasyn (11/27>> Completed Ceftriaxone 11/20-11/25 (pna) Azithromycin 11/20-11/25  (pna)  Microbiology results: 11/21 BCx: NGx5d 11/23 UCx: NGx5d   Thank you for allowing pharmacy to be a part of this patient's care.  12/23 11/12/2020 2:29 PM

## 2020-11-12 NOTE — Anesthesia Preprocedure Evaluation (Addendum)
Anesthesia Evaluation  Patient identified by MRN, date of birth, ID band Patient awake    Reviewed: Allergy & Precautions, H&P , NPO status , Patient's Chart, lab work & pertinent test results  History of Anesthesia Complications Negative for: history of anesthetic complications  Airway Mallampati: III  TM Distance: >3 FB     Dental  (+) Edentulous Lower, Edentulous Upper   Pulmonary neg sleep apnea, pneumonia, resolved, neg COPD, Current Smoker and Patient abstained from smoking.,  Admitted with pneumonia, completed abx course, now on room air.  Signs/symptoms of OSA    + decreased breath sounds      Cardiovascular hypertension, (-) angina+ Cardiac Stents and +CHF  (-) Past MI (-) dysrhythmias  Rhythm:regular Rate:Tachycardia  Echo 11/08/20: 1. Left ventricular ejection fraction, by estimation, is 65 to 70%. The  left ventricle has normal function. The left ventricle has no regional  wall motion abnormalities. Left ventricular diastolic parameters were  normal.  2. Right ventricular systolic function is normal. The right ventricular  size is normal.  3. The mitral valve is grossly normal. No evidence of mitral valve  regurgitation.  4. The aortic valve is grossly normal. Aortic valve regurgitation is not  visualized.    Neuro/Psych CVA negative psych ROS   GI/Hepatic negative GI ROS, Neg liver ROS,   Endo/Other  diabetes  Renal/GU ARFRenal disease     Musculoskeletal   Abdominal   Peds  Hematology  (+) Blood dyscrasia, anemia , Hgb 9.6   Anesthesia Other Findings Past Medical History: No date: Diabetes mellitus without complication (HCC) No date: Hyperlipidemia No date: Hypertension No date: Neuropathy No date: Stroke (HCC)   Reproductive/Obstetrics negative OB ROS                          Anesthesia Physical Anesthesia Plan  ASA: III  Anesthesia Plan: General ETT    Post-op Pain Management:    Induction:   PONV Risk Score and Plan: Ondansetron, Midazolam and Treatment may vary due to age or medical condition  Airway Management Planned:   Additional Equipment:   Intra-op Plan:   Post-operative Plan:   Informed Consent: I have reviewed the patients History and Physical, chart, labs and discussed the procedure including the risks, benefits and alternatives for the proposed anesthesia with the patient or authorized representative who has indicated his/her understanding and acceptance.     Dental Advisory Given  Plan Discussed with: Anesthesiologist, CRNA and Surgeon  Anesthesia Plan Comments:         Anesthesia Quick Evaluation

## 2020-11-12 NOTE — TOC Progression Note (Signed)
Transition of Care [facility]) - Progression Note    Patient Details  Name: Georgianna Band MRN: [id number] Date of Birth: 1960/12/27  [facility] CM/SW Contact  Eilleen Kempf, LCSW Phone Number: 11/12/2020, 1:53 PM  Clinical Narrative:    CSW spoke with patient at bedside to discuss discharge plan. CSW explained recommendations are for patient to go to a facility for rehab. Patient adamantly refused stating she plans to go back home with HH/PT and is active with [facility].    Expected Discharge Plan: Home w Home Health Services Barriers to Discharge: Continued Medical Work up  Expected Discharge Plan and Services Expected Discharge Plan: Home w Home Health Services   Discharge Planning Services: CM Consult Post Acute Care Choice: Home Health, Resumption of Svcs/PTA Provider Living arrangements for the past 2 months: Single Family Home                   DME Agency: NA       HH Arranged: RN, PT HH Agency: [facility]         Social Determinants of Health (SDOH) Interventions    Readmission Risk Interventions No flowsheet data found.

## 2020-11-12 NOTE — Consult Note (Signed)
Date of Consultation:  11/12/2020   Requesting Physician:  Joycelyn Das, MD  Reason for Consultation:  Buttocks abscess  History of Present Illness: Tina Gregory is a 59 y.o. female currently admitted to the medical team for aspiration pneumonia and AKI.  Patient has been noted to have a wound of the right buttocks area and the wound ostomy nurse was consulted today for evaluation.  The patient reports that she has had this wound for about a month.  Reports that this has been progressing but has not sought any care yet.  Reports that this has been draining and is tender particularly with bowel movements.  The drainage has been purulent and bloody in nature.  She denies having fevers at home or chills.  She has been getting treatment with IV antibiotics for her pneumonia and fluids for her AKI.  She initially was having also issues with altered mental status which has improved.  CT scan of the head and MRI of the head were also negative for acute pathology.  She is on aspirin and Plavix for history of CVA in the past.  Past Medical History: Past Medical History:  Diagnosis Date  . Diabetes mellitus without complication (HCC)   . Hyperlipidemia   . Hypertension   . Neuropathy   . Stroke [facility])      Past Surgical History: History reviewed. No pertinent surgical history.  Home Medications: Prior to Admission medications   Medication Sig Start Date End Date Taking? Authorizing Provider  allopurinol (ZYLOPRIM) 300 MG tablet Take 300 mg by mouth daily. 09/07/20  Yes [provider]  amitriptyline (ELAVIL) 50 MG tablet Take 50 mg by mouth at bedtime. 08/23/20  Yes [provider]  aspirin EC 81 MG tablet Take 81 mg by mouth daily. Swallow whole.   Yes [provider]  bisacodyl (DULCOLAX) 5 MG EC tablet Take 5 mg by mouth daily as needed for moderate constipation.   Yes [provider]  carvedilol (COREG) 25 MG tablet Take 25 mg by mouth 2 (two) times daily.  09/07/20  Yes [provider]  cetirizine (ZYRTEC) 10 MG tablet Take 10 mg by mouth daily.   Yes [provider]  cholecalciferol (VITAMIN D3) 25 MCG (1000 UNIT) tablet Take 1,000 Units by mouth daily.   Yes [provider]  cloNIDine (CATAPRES) 0.2 MG tablet Take 0.2 mg by mouth 2 (two) times daily. 10/25/20  Yes [provider]  clopidogrel (PLAVIX) 75 MG tablet Take 75 mg by mouth daily. 08/17/20  Yes [provider]  clotrimazole-betamethasone (LOTRISONE) cream Apply 1 application topically 2 (two) times daily.  09/09/20  Yes [provider]  furosemide (LASIX) 20 MG tablet Take 20 mg by mouth 2 (two) times daily. 09/07/20  Yes [provider]  LEVEMIR FLEXTOUCH 100 UNIT/ML FlexPen Inject 80 Units into the skin daily.  10/24/20  Yes [provider]  levothyroxine (SYNTHROID) 25 MCG tablet Take 25 mcg by mouth daily. 09/07/20  Yes [provider]  losartan (COZAAR) 100 MG tablet Take 100 mg by mouth daily.  10/04/20  Yes [provider]  NOVOLOG FLEXPEN 100 UNIT/ML FlexPen Inject 20 Units into the skin 3 (three) times daily with meals.  10/24/20  Yes [provider]  ondansetron (ZOFRAN) 4 MG tablet Take 4-8 mg by mouth every 8 (eight) hours as needed for nausea or vomiting.   Yes [provider]  pantoprazole (PROTONIX) 40 MG tablet Take 40 mg by mouth daily. 08/16/20  Yes [provider]  Potassium 99 MG TABS Take 99 mg by mouth daily.   Yes [provider]  pregabalin (LYRICA) 100 MG capsule Take 100 mg by mouth 3 (three) times daily. 10/25/20  Yes [provider]  rosuvastatin (CRESTOR) 20 MG tablet Take 20 mg by mouth at bedtime. 10/04/20  Yes [provider]  TRULICITY 0.75 MG/0.5ML SOPN Inject 0.75 mg into the skin every Wednesday.  10/25/20  Yes [provider]  vitamin B-12 (CYANOCOBALAMIN) 1000 MCG tablet Take 1,000 mcg by mouth daily.   Yes  [provider]  azithromycin (ZITHROMAX Z-PAK) 250 MG tablet Take 2 tablets (500 mg) on  Day 1,  followed by 1 tablet (250 mg) once daily on Days 2 through 5. 11/05/20   Cuthriell, Delorise Royals, PA-C    Allergies: Allergies  Allergen Reactions  . Lisinopril     Headache, shaking  . Shellfish Allergy Hives    Social History:  reports that she has been smoking cigarettes. She has been smoking about 0.50 packs per day. She has never used smokeless tobacco. She reports that she does not drink alcohol and does not use drugs.   Family History: History reviewed. No pertinent family history.  Review of Systems: Review of Systems  Constitutional: Negative for chills and fever.  HENT: Negative for hearing loss.   Respiratory: Negative for shortness of breath.   Cardiovascular: Negative for chest pain.  Gastrointestinal: Negative for abdominal pain, nausea and vomiting.  Genitourinary: Negative for dysuria.  Musculoskeletal: Negative for myalgias.  Skin:       Right buttocks wound with drainage  Neurological: Negative for dizziness.  Psychiatric/Behavioral: Negative for depression.    Physical Exam BP (!) 159/74 (BP Location: Right Arm)   Pulse 67   Temp 97.7 F (36.5 C) (Oral)   Resp 16   Ht 5\' 5"  (1.651 m)   Wt 109.3 kg   SpO2 97%   BMI 40.10 kg/m  CONSTITUTIONAL: No acute distress HEENT:  Normocephalic, atraumatic, extraocular motion intact. NECK: Trachea is midline, and there is no jugular venous distension. RESPIRATORY:  Normal respiratory effort without pathologic use of accessory muscles. CARDIOVASCULAR: Regular rhythm and rate. GI: The abdomen is soft, obese, nondistended.  MUSCULOSKELETAL:  Normal muscle strength and tone in all four extremities.  No peripheral edema or cyanosis. SKIN: The patient has an approximately 8 x 3 cm area of ulceration in the medial right buttocks, close to the anal verge.  The underlying tissue is necrotic fibrinous with slough.   Unable to tell how deep the cavity is.  Does not appear to be connected to the anal canal. NEUROLOGIC:  Motor and sensation is grossly normal.  Cranial nerves are grossly intact. PSYCH:  Alert and oriented to person, place and time. Affect is normal.  Laboratory Analysis: Results for orders placed or performed during the hospital encounter of 11/05/20 (from the past 24 hour(s))  Glucose, capillary     Status: Abnormal   Collection Time: 11/11/20 12:52 PM  Result Value Ref Range   Glucose-Capillary 131 (H) 70 - 99 mg/dL  Glucose, capillary     Status: None   Collection Time: 11/11/20  5:04 PM  Result Value Ref Range   Glucose-Capillary 83 70 - 99 mg/dL   Comment 1 Notify RN   Glucose, capillary     Status: Abnormal   Collection Time: 11/11/20 10:27 PM  Result Value Ref Range   Glucose-Capillary 179 (H) 70 - 99 mg/dL  Basic  metabolic panel     Status: Abnormal   Collection Time: 11/12/20  5:07 AM  Result Value Ref Range   Sodium 138 135 - 145 mmol/L   Potassium 3.4 (L) 3.5 - 5.1 mmol/L   Chloride 106 98 - 111 mmol/L   CO2 22 22 - 32 mmol/L   Glucose, Bld 64 (L) 70 - 99 mg/dL   BUN 43 (H) 6 - 20 mg/dL   Creatinine, Ser 3.42 (H) 0.44 - 1.00 mg/dL   Calcium 8.9 8.9 - 87.6 mg/dL   GFR, Estimated 11 (L) >60 mL/min   Anion gap 10 5 - 15  CBC     Status: Abnormal   Collection Time: 11/12/20  5:07 AM  Result Value Ref Range   WBC 10.7 (H) 4.0 - 10.5 K/uL   RBC 3.70 (L) 3.87 - 5.11 MIL/uL   Hemoglobin 9.6 (L) 12.0 - 15.0 g/dL   HCT 81.1 (L) 36 - 46 %   MCV 79.7 (L) 80.0 - 100.0 fL   MCH 25.9 (L) 26.0 - 34.0 pg   MCHC 32.5 30.0 - 36.0 g/dL   RDW 57.2 (H) 62.0 - 35.5 %   Platelets 301 150 - 400 K/uL   nRBC 0.0 0.0 - 0.2 %  Glucose, capillary     Status: None   Collection Time: 11/12/20  7:55 AM  Result Value Ref Range   Glucose-Capillary 72 70 - 99 mg/dL  Glucose, capillary     Status: Abnormal   Collection Time: 11/12/20 11:23 AM  Result Value Ref Range   Glucose-Capillary 117  (H) 70 - 99 mg/dL    Imaging: No results found.  Assessment and Plan: This is a 59 y.o. female with a right buttocks abscess.  -Discussed with the patient that she has a right buttocks abscess that does not appear to be connected to the anal canal or perianal area.  Do suspect that this started as a skin abscess or potentially a pressure ulcer that has gotten worse.  I cannot tell the true depth of this cavity as it is full of necrotic fibrinous material.  There is no significant induration or erythema except laterally.  Discussed with her that it would be best to take her to the operating room for debridement and drainage of this abscess.  Discussed with her the risks of bleeding, infection, injury to surrounding structures, need for further procedures, and she is willing to proceed.  Discussed with her postoperative pain issues as well as dressing changes daily.  She is willing to proceed.  We'll schedule her for debridement tomorrow.  Will make her n.p.o. after midnight.  She had completed her antibiotic course for her pneumonia on admission.  I will start her on Unasyn for now.  Face-to-face time spent with the patient and care providers was 55 minutes, with more than 50% of the time spent counseling, educating, and coordinating care of the patient.     Howie Ill, MD [facility] Pg:  [phone]

## 2020-11-12 NOTE — Progress Notes (Addendum)
PROGRESS NOTE  Tina Gregory CZY:[id number] DOB: 10-15-1961 DOA: 11/05/2020 PCP: Pcp, No   LOS: 7 days   Brief narrative: As per HPI,  Tina McGeeis a 59 y.o.femalewith medical history significant ofdiabetes, hypertension, diabetic neuropathy, history of CVA, hyperlipidemia was brought in to the ED on 11/05/20 for generalized weakness and a fall while trying to get in a wheelchair in [place] parking lot.      Evaluation in the emergency department revealed a leukocytosis with low-grade fever, tachycardia tachypnea.  Her creatinine level was elevated at 2.9.  Chest x-ray showed bibasilar opacities concerning for pneumonia.  X-ray of the left knee was unremarkable.  Patient was then admitted to hospital for community-acquired pneumonia and acute kidney injury.    Patient did have urinary retention and required Foley catheter.  Nephrology was consulted during hospitalization.  Patient still has significantly elevated creatinine and acute kidney injury but is trending down..  Assessment/Plan:  Principal Problem:   CAP (community acquired pneumonia) Active Problems:   AKI (acute kidney injury) (HCC)   Diabetes (HCC)   Essential hypertension   Hyperlipemia  Severe sepsis due to community-acquired pneumonia -completed 5-day course of antibiotic with Rocephin and Zithromax.  Currently on room air.  Improved.  Mild hypokalemia.  Will replenish orally.  Check BMP in a.m.  Acute urinary retention -with acute kidney injury, continue Foley catheter for now.  We will try voiding trial when renal function continues to improve.  Acute kidney injury with proteinuria, with metabolic acidosis.  Nephrology on board.  On Foley catheter.  Continue to hold Lasix and losartan.  Currently on sodium bicarb drip.   Continue strict intake and output charting.  Trending down creatinine levels.  Hold PPI.  Lab Results  Component Value Date   CREATININE 4.42 (H) 11/12/2020   CREATININE 4.81 (H) 11/11/2020     CREATININE 5.05 (H) 11/10/2020    Acute metabolic encephalopathy -likely multifactorial from sedatives, infection and renal failure.  Improved at this time.  Arterial blood gas without hypercarbia.  CT head scan was negative.  Ammonia levels were normal as well.  MRI showed chronic microvascular changes.  Has been treated pneumonia.  Improving  Elevated LFTs.-Unclear.  Right upper quadrant ultrasound showed increased echogenicity.  Slightly irregular.  Cirrhosis could not be excluded.  Will need outpatient follow-up for LFTs.   Likely OSA  -will benefit from outpatient sleep study.  ABG without hypercarbia.  Type 2 diabetes with neuropathy -patient is on Levemir 35 units BID, Novolog 20-25 units TID WC and Trulicity at home.  Due to underlying acute renal failure patient had episodes of hypoglycemia.  Decreased dose of long-acting and and sliding scale insulin at this time.    Essential hypertension   Coreg, clonidine, Lasix and losartan at home.  Continue to hold losartan and Lasix due to AKI.  Continue Coreg and clonidine.  Blood pressure is marginally elevated.  Add hydralazine p.o. 10 mg every 8 hourly for now.  Hx of CVA   continue aspirin and Plavix.  MRI of the brain was negative for acute findings.  No focal findings  Depression / Anxiety - continue Elavil but at a decreased dose.  Currently on 10 mg.  Was on 50 mg at home.  Hypothyroidism - continue levothyroxine  Gout -hold allopurinol due to acute kidney injury  Morbid obesity: Body mass index is 40.1 kg/m.    Would benefit from lifestyle modification to lose weight as outpatient.  DVT prophylaxis: Heparin subcu  Perirectal abscess with  mild drainage.  Likely peri rectal abscess, patient still complains of the pain around that area.  Wound care on board. Received 5 days of IV Rocephin.  Will consult surgery for further opinion.  Code Status: Full code  Family Communication:   None today.    Status is:  Inpatient  Remains inpatient appropriate because:IV treatments appropriate due to intensity of illness or inability to take PO, Inpatient level of care appropriate due to severity of illness and Likely need for skilled nursing facility placement, IV antibiotics, acute kidney injury, perirectal abscess   Dispo: The patient is from: Home              Anticipated d/c is to: SNF as per physical therapy recommendations but patient is thinking about home with home health.              Anticipated d/c date is: 2 to 3 days              Patient currently is not medically stable to d/c.  Consultants:  Nephrology  Neurology  General surgery   Procedures:  Foley catheter placement  Antibiotics:  . Rocephin and Zithromax 11/20>11/25  Subjective:  Today, patient was seen and examined at bedside.  Complains of swelling around the perirectal area with pain.  Has decreased diarrhea at this time.  No shortness of breath cough fever chest pain.   Objective: Vitals:   11/11/20 2231 11/12/20 0005  BP: (!) 165/83 (!) 153/78  Pulse: 68 65  Resp:  18  Temp: 97.6 F (36.4 C) 98.9 F (37.2 C)  SpO2: 95% 100%    Intake/Output Summary (Last 24 hours) at 11/12/2020 0735 Last data filed at 11/12/2020 0653 Gross per 24 hour  Intake 898.77 ml  Output 1450 ml  Net -551.23 ml   Filed Weights   11/05/20 1423  Weight: 109.3 kg   Body mass index is 40.1 kg/m.   Physical Exam: General: Obese built, not in obvious distress, alert awake and communicative HENT:   No scleral pallor or icterus noted. Oral mucosa is moist.  Chest: Decreased breath sounds bilaterally.   CVS: S1 &S2 heard. No murmur.  Regular rate and rhythm. Abdomen: Soft, nontender, nondistended.  Bowel sounds are heard.  Foley catheter.  Rectal area examined with the area of induration and tenderness with minimal drainage possibly perirectal abscess Extremities: No cyanosis, clubbing, trace peripheral edema peripheral pulses are  palpable. Psych: Alert, awake and oriented, normal mood CNS:  No cranial nerve deficits.  Power equal in all extremities.  Generalized weakness noted Skin: Warm and dry.  Perirectal swelling.   Data Review: I have personally reviewed the following laboratory data and studies,  CBC: Recent Labs  Lab 11/06/20 0046 11/06/20 0046 11/07/20 0933 11/07/20 0933 11/08/20 0554 11/09/20 0545 11/10/20 0537 11/11/20 0542 11/12/20 0507  WBC 16.8*   < > 12.2*   < > 11.7* 12.5* 12.0* 10.7* 10.7*  NEUTROABS 12.0*  --  7.9*  --   --   --   --   --   --   HGB 9.8*   < > 9.5*   < > 9.2* 9.3* 9.6* 9.2* 9.6*  HCT 30.8*   < > 29.8*   < > 29.3* 29.0* 28.7* 28.5* 29.5*  MCV 81.7   < > 81.9   < > 82.3 81.9 78.8* 79.8* 79.7*  PLT 174   < > 183   < > 203 204 246 272 301   < > =  values in this interval not displayed.   Basic Metabolic Panel: Recent Labs  Lab 11/08/20 1822 11/09/20 0545 11/10/20 0537 11/11/20 0542 11/12/20 0507  NA 134* 137 136 138 138  K 4.1 3.8 3.7 3.7 3.4*  CL 104 106 103 105 106  CO2 18* 19* 20* 21* 22  GLUCOSE 268* 57* 58* 210* 64*  BUN 49* 52* 49* 47* 43*  CREATININE 4.77* 4.91* 5.05* 4.81* 4.42*  CALCIUM 8.5* 8.8* 9.1 8.8* 8.9  MG  --   --  2.4 2.2  --   PHOS  --  6.1*  --   --   --    Liver Function Tests: Recent Labs  Lab 11/06/20 0046 11/06/20 0046 11/07/20 0933 11/08/20 0554 11/08/20 1822 11/09/20 0545 11/11/20 0542  AST 64*  --  107* 93* 126*  --  103*  ALT 53*  --  62* 55* 64*  --  65*  ALKPHOS 132*  --  193* 205* 260*  --  372*  BILITOT 1.1  --  1.3* 1.4* 1.9*  --  1.2  PROT 7.0  --  6.8 7.0 7.2  --  7.3  ALBUMIN 2.3*   < > 2.0* 2.1* 1.9* 1.9* 1.8*   < > = values in this interval not displayed.   No results for input(s): LIPASE, AMYLASE in the last 168 hours. Recent Labs  Lab 11/07/20 1425  AMMONIA 27   Cardiac Enzymes: No results for input(s): CKTOTAL, CKMB, CKMBINDEX, TROPONINI in the last 168 hours. BNP (last 3 results) No results for  input(s): BNP in the last 8760 hours.  ProBNP (last 3 results) No results for input(s): PROBNP in the last 8760 hours.  CBG: Recent Labs  Lab 11/10/20 2106 11/11/20 0935 11/11/20 1252 11/11/20 1704 11/11/20 2227  GLUCAP 175* 171* 131* 83 179*   Recent Results (from the past 240 hour(s))  Resp Panel by RT-PCR (Flu A&B, Covid) Nasopharyngeal Swab     Status: None   Collection Time: 11/05/20  9:36 PM   Specimen: Nasopharyngeal Swab; Nasopharyngeal(NP) swabs in vial transport medium  Result Value Ref Range Status   SARS Coronavirus 2 by RT PCR NEGATIVE NEGATIVE Final    Comment: (NOTE) SARS-CoV-2 target nucleic acids are NOT DETECTED.  The SARS-CoV-2 RNA is generally detectable in upper respiratory specimens during the acute phase of infection. The lowest concentration of SARS-CoV-2 viral copies this assay can detect is 138 copies/mL. A negative result does not preclude SARS-Cov-2 infection and should not be used as the sole basis for treatment or other patient management decisions. A negative result may occur with  improper specimen collection/handling, submission of specimen other than nasopharyngeal swab, presence of viral mutation(s) within the areas targeted by this assay, and inadequate number of viral copies(<138 copies/mL). A negative result must be combined with clinical observations, patient history, and epidemiological information. The expected result is Negative.  Fact Sheet for Patients:  BloggerCourse.com  Fact Sheet for Healthcare Providers:  SeriousBroker.it  This test is no t yet approved or cleared by the [place] FDA and  has been authorized for detection and/or diagnosis of SARS-CoV-2 by FDA under an Emergency Use Authorization (EUA). This EUA will remain  in effect (meaning this test can be used) for the duration of the COVID-19 declaration under Section 564(b)(1) of the Act, 21 U.S.C.section  360bbb-3(b)(1), unless the authorization is terminated  or revoked sooner.       Influenza A by PCR NEGATIVE NEGATIVE Final   Influenza B by  PCR NEGATIVE NEGATIVE Final    Comment: (NOTE) The Xpert Xpress SARS-CoV-2/FLU/RSV plus assay is intended as an aid in the diagnosis of influenza from Nasopharyngeal swab specimens and should not be used as a sole basis for treatment. Nasal washings and aspirates are unacceptable for Xpert Xpress SARS-CoV-2/FLU/RSV testing.  Fact Sheet for Patients: BloggerCourse.com  Fact Sheet for Healthcare Providers: SeriousBroker.it  This test is not yet approved or cleared by the [place] FDA and has been authorized for detection and/or diagnosis of SARS-CoV-2 by FDA under an Emergency Use Authorization (EUA). This EUA will remain in effect (meaning this test can be used) for the duration of the COVID-19 declaration under Section 564(b)(1) of the Act, 21 U.S.C. section 360bbb-3(b)(1), unless the authorization is terminated or revoked.  Performed at [facility], [address] [place], [place] [address]   Culture, blood (routine x 2) Call MD if unable to obtain prior to antibiotics being given     Status: None   Collection Time: 11/06/20 12:46 AM   Specimen: BLOOD  Result Value Ref Range Status   Specimen Description BLOOD LEFT ANTECUBITAL  Final   Special Requests   Final    BOTTLES DRAWN AEROBIC AND ANAEROBIC Blood Culture adequate volume   Culture   Final    NO GROWTH 5 DAYS Performed at [facility], [address] [place], [place] [address]    Report Status 11/11/2020 FINAL  Final  Culture, blood (routine x 2) Call MD if unable to obtain prior to antibiotics being given     Status: None   Collection Time: 11/06/20 12:46 AM   Specimen: BLOOD  Result Value Ref Range Status   Specimen Description BLOOD BLOOD LEFT FOREARM  Final   Special Requests   Final     BOTTLES DRAWN AEROBIC AND ANAEROBIC Blood Culture adequate volume   Culture   Final    NO GROWTH 5 DAYS Performed at [facility], [address] [place], [place] [address]    Report Status 11/11/2020 FINAL  Final  Urine Culture     Status: None   Collection Time: 11/08/20  9:11 AM   Specimen: Urine, Random  Result Value Ref Range Status   Specimen Description   Final    URINE, RANDOM Performed at [facility], [address] [place], [place] [address]    Special Requests   Final    NONE Performed at [facility], [address], [place], [place] [address]    Culture   Final    NO GROWTH Performed at [facility], [address], [place], [place] [address]    Report Status 11/09/2020 FINAL  Final     Studies: No results found.    Joycelyn Das, MD  [facility] 11/12/2020

## 2020-11-12 NOTE — Progress Notes (Signed)
[facility]  ROUNDING NOTE   Subjective:   Ms. Coop is 59 years old African-American female with history of diabetes mellitus, hypertension, diabetic neuropathy, CVA, hyperlipidemia who was admitted on 11/05/2020 with community-acquired pneumonia and acute kidney injury.  Patient complains of her perirectal abscess. More awake. But still complains of sleepiness and fatigue.   UOP .   Objective:  Vital signs in last 24 hours:  Temp:  [97.6 F (36.4 C)-98.9 F (37.2 C)] 98.6 F (37 C) (11/27 0755) Pulse Rate:  [64-68] 65 (11/27 0755) Resp:  [18] 18 (11/27 0755) BP: (143-165)/(76-122) 143/122 (11/27 0755) SpO2:  [93 %-100 %] 93 % (11/27 0755)  Weight change:  Filed Weights   11/05/20 1423  Weight: 109.3 kg    Intake/Output: I/O last 3 completed shifts: In: 1532.2 [I.V.:1532.2] Out: 3100 [Urine:3100]   Intake/Output this shift:  No intake/output data recorded.  Physical Exam: General: Laying in bed  Head: Normocephalic,atraumatic  Eyes: Sclerae and conjunctivae clear  Lungs:  Diminished bilaterally   Heart: regular  Abdomen:  Soft, nontender, obese  Extremities: No peripheral edema.  Neurologic: Alert,awake, speech clear and appropriate        Basic Metabolic Panel: Recent Labs  Lab 11/08/20 1822 11/08/20 1822 11/09/20 0545 11/09/20 0545 11/10/20 0537 11/11/20 0542 11/12/20 0507  NA 134*  --  137  --  136 138 138  K 4.1  --  3.8  --  3.7 3.7 3.4*  CL 104  --  106  --  103 105 106  CO2 18*  --  19*  --  20* 21* 22  GLUCOSE 268*  --  57*  --  58* 210* 64*  BUN 49*  --  52*  --  49* 47* 43*  CREATININE 4.77*  --  4.91*  --  5.05* 4.81* 4.42*  CALCIUM 8.5*   < > 8.8*   < > 9.1 8.8* 8.9  MG  --   --   --   --  2.4 2.2  --   PHOS  --   --  6.1*  --   --   --   --    < > = values in this interval not displayed.    Liver Function Tests: Recent Labs  Lab 11/06/20 0046 11/06/20 0046 11/07/20 0933 11/08/20 0554 11/08/20 1822  11/09/20 0545 11/11/20 0542  AST 64*  --  107* 93* 126*  --  103*  ALT 53*  --  62* 55* 64*  --  65*  ALKPHOS 132*  --  193* 205* 260*  --  372*  BILITOT 1.1  --  1.3* 1.4* 1.9*  --  1.2  PROT 7.0  --  6.8 7.0 7.2  --  7.3  ALBUMIN 2.3*   < > 2.0* 2.1* 1.9* 1.9* 1.8*   < > = values in this interval not displayed.   No results for input(s): LIPASE, AMYLASE in the last 168 hours. Recent Labs  Lab 11/07/20 1425  AMMONIA 27    CBC: Recent Labs  Lab 11/06/20 0046 11/06/20 0046 11/07/20 0933 11/07/20 0933 11/08/20 0554 11/09/20 0545 11/10/20 0537 11/11/20 0542 11/12/20 0507  WBC 16.8*   < > 12.2*   < > 11.7* 12.5* 12.0* 10.7* 10.7*  NEUTROABS 12.0*  --  7.9*  --   --   --   --   --   --   HGB 9.8*   < > 9.5*   < > 9.2* 9.3* 9.6* 9.2*  9.6*  HCT 30.8*   < > 29.8*   < > 29.3* 29.0* 28.7* 28.5* 29.5*  MCV 81.7   < > 81.9   < > 82.3 81.9 78.8* 79.8* 79.7*  PLT 174   < > 183   < > 203 204 246 272 301   < > = values in this interval not displayed.    Cardiac Enzymes: No results for input(s): CKTOTAL, CKMB, CKMBINDEX, TROPONINI in the last 168 hours.  BNP: Invalid input(s): POCBNP  CBG: Recent Labs  Lab 11/11/20 0935 11/11/20 1252 11/11/20 1704 11/11/20 2227 11/12/20 0755  GLUCAP 171* 131* 83 179* 72    Microbiology: Results for orders placed or performed during the hospital encounter of 11/05/20  Resp Panel by RT-PCR (Flu A&B, Covid) Nasopharyngeal Swab     Status: None   Collection Time: 11/05/20  9:36 PM   Specimen: Nasopharyngeal Swab; Nasopharyngeal(NP) swabs in vial transport medium  Result Value Ref Range Status   SARS Coronavirus 2 by RT PCR NEGATIVE NEGATIVE Final    Comment: (NOTE) SARS-CoV-2 target nucleic acids are NOT DETECTED.  The SARS-CoV-2 RNA is generally detectable in upper respiratory specimens during the acute phase of infection. The lowest concentration of SARS-CoV-2 viral copies this assay can detect is 138 copies/mL. A negative result  does not preclude SARS-Cov-2 infection and should not be used as the sole basis for treatment or other patient management decisions. A negative result may occur with  improper specimen collection/handling, submission of specimen other than nasopharyngeal swab, presence of viral mutation(s) within the areas targeted by this assay, and inadequate number of viral copies(<138 copies/mL). A negative result must be combined with clinical observations, patient history, and epidemiological information. The expected result is Negative.  Fact Sheet for Patients:  BloggerCourse.com  Fact Sheet for Healthcare Providers:  SeriousBroker.it  This test is no t yet approved or cleared by the [place] FDA and  has been authorized for detection and/or diagnosis of SARS-CoV-2 by FDA under an Emergency Use Authorization (EUA). This EUA will remain  in effect (meaning this test can be used) for the duration of the COVID-19 declaration under Section 564(b)(1) of the Act, 21 U.S.C.section 360bbb-3(b)(1), unless the authorization is terminated  or revoked sooner.       Influenza A by PCR NEGATIVE NEGATIVE Final   Influenza B by PCR NEGATIVE NEGATIVE Final    Comment: (NOTE) The Xpert Xpress SARS-CoV-2/FLU/RSV plus assay is intended as an aid in the diagnosis of influenza from Nasopharyngeal swab specimens and should not be used as a sole basis for treatment. Nasal washings and aspirates are unacceptable for Xpert Xpress SARS-CoV-2/FLU/RSV testing.  Fact Sheet for Patients: BloggerCourse.com  Fact Sheet for Healthcare Providers: SeriousBroker.it  This test is not yet approved or cleared by the [place] FDA and has been authorized for detection and/or diagnosis of SARS-CoV-2 by FDA under an Emergency Use Authorization (EUA). This EUA will remain in effect (meaning this test can be used) for  the duration of the COVID-19 declaration under Section 564(b)(1) of the Act, 21 U.S.C. section 360bbb-3(b)(1), unless the authorization is terminated or revoked.  Performed at [facility], [address] [place], [place] [address]   Culture, blood (routine x 2) Call MD if unable to obtain prior to antibiotics being given     Status: None   Collection Time: 11/06/20 12:46 AM   Specimen: BLOOD  Result Value Ref Range Status   Specimen Description BLOOD LEFT ANTECUBITAL  Final  Special Requests   Final    BOTTLES DRAWN AEROBIC AND ANAEROBIC Blood Culture adequate volume   Culture   Final    NO GROWTH 5 DAYS Performed at [facility], [address] [place], [place] [address]    Report Status 11/11/2020 FINAL  Final  Culture, blood (routine x 2) Call MD if unable to obtain prior to antibiotics being given     Status: None   Collection Time: 11/06/20 12:46 AM   Specimen: BLOOD  Result Value Ref Range Status   Specimen Description BLOOD BLOOD LEFT FOREARM  Final   Special Requests   Final    BOTTLES DRAWN AEROBIC AND ANAEROBIC Blood Culture adequate volume   Culture   Final    NO GROWTH 5 DAYS Performed at [facility], [address] [place], [place] [address]    Report Status 11/11/2020 FINAL  Final  Urine Culture     Status: None   Collection Time: 11/08/20  9:11 AM   Specimen: Urine, Random  Result Value Ref Range Status   Specimen Description   Final    URINE, RANDOM Performed at [facility], [address] [place], [place] [address]    Special Requests   Final    NONE Performed at [facility], [address] [place], [place] [address]    Culture   Final    NO GROWTH Performed at [facility], [address] [place], [place] [address]    Report Status 11/09/2020 FINAL  Final    Coagulation Studies: No results for input(s): LABPROT, INR in the last 72 hours.  Urinalysis: No results for input(s):  COLORURINE, LABSPEC, PHURINE, GLUCOSEU, HGBUR, BILIRUBINUR, KETONESUR, PROTEINUR, UROBILINOGEN, NITRITE, LEUKOCYTESUR in the last 72 hours.  Invalid input(s): APPERANCEUR    Imaging: No results found.   Medications:   . sodium bicarbonate in D5W 1000 mL infusion 50 mL/hr at 11/12/20 0100   . amitriptyline  10 mg Oral QHS  . aspirin EC  81 mg Oral Daily  . carvedilol  25 mg Oral BID  . Chlorhexidine Gluconate Cloth  6 each Topical Daily  . cloNIDine  0.2 mg Oral BID  . clopidogrel  75 mg Oral Daily  . feeding supplement  237 mL Oral TID with meals  . heparin injection (subcutaneous)  5,000 Units Subcutaneous Q8H  . hydrALAZINE  10 mg Oral Q8H  . insulin aspart  0-15 Units Subcutaneous TID WC  . insulin aspart  0-5 Units Subcutaneous QHS  . insulin aspart  2 Units Subcutaneous TID WC  . insulin detemir  20 Units Subcutaneous BID  . levothyroxine  25 mcg Oral Daily  . multivitamin with minerals  1 tablet Oral Daily  . nicotine  21 mg Transdermal Daily  . saccharomyces boulardii  250 mg Oral BID   acetaminophen, dextrose, guaiFENesin-dextromethorphan, loperamide, traMADol  Assessment/ Plan:  Ms. Tresea Malleggy Bouwman is a 59 y.o.  female with history of diabetes mellitus, hypertension, diabetic neuropathy, CVA, hyperlipidemia who was admitted on 11/05/2020 with community-acquired pneumonia and acute kidney injury.  # Acute kidney injury with proteinuria Baseline creatinine unknown. Creatinine on admission of 2.96, GFR of 18.  Lab Results  Component Value Date   CREATININE 4.42 (H) 11/12/2020   CREATININE 4.81 (H) 11/11/2020   CREATININE 5.05 (H) 11/10/2020   No acute indication for dialysis Will continue IVF 50 ml/hr Will continue monitoring renal function daily  #Metabolic Acidosis   Continue Sodium bicarbonate infusion 50 ml/hr  #  Diabetes type II with CKD Lab Results  Component Value Date   HGBA1C 10.8 (H) 11/06/2020  Intermittent hypoglycemic episodes during this  admission Not well controlled.   #Hypertension Blood pressure remains elevated.  Continue clonidine and Carvedilol   S 7 Paula Busenbark 11/27/202110:05 AM

## 2020-11-13 ENCOUNTER — Inpatient Hospital Stay: Payer: Medicare Other | Admitting: Anesthesiology

## 2020-11-13 ENCOUNTER — Encounter
Admission: EM | Disposition: A | Discharge status: Home Health | Payer: Self-pay | Source: Home / Self Care | Attending: Internal Medicine

## 2020-11-13 DIAGNOSIS — I10 Essential (primary) hypertension: Secondary | ICD-10-CM | POA: Diagnosis not present

## 2020-11-13 DIAGNOSIS — J189 Pneumonia, unspecified organism: Secondary | ICD-10-CM | POA: Diagnosis not present

## 2020-11-13 DIAGNOSIS — N179 Acute kidney failure, unspecified: Secondary | ICD-10-CM | POA: Diagnosis not present

## 2020-11-13 DIAGNOSIS — E1142 Type 2 diabetes mellitus with diabetic polyneuropathy: Secondary | ICD-10-CM | POA: Diagnosis not present

## 2020-11-13 HISTORY — PX: INCISION AND DRAINAGE PERIRECTAL ABSCESS: SHX1804

## 2020-11-13 LAB — BASIC METABOLIC PANEL
Anion gap: 10 (ref 5–15)
BUN: 36 mg/dL — ABNORMAL HIGH (ref 6–20)
CO2: 24 mmol/L (ref 22–32)
Calcium: 8.9 mg/dL (ref 8.9–10.3)
Chloride: 104 mmol/L (ref 98–111)
Creatinine, Ser: 4.03 mg/dL — ABNORMAL HIGH (ref 0.44–1.00)
GFR, Estimated: 12 mL/min — ABNORMAL LOW (ref >60)
Glucose, Bld: 167 mg/dL — ABNORMAL HIGH (ref 70–99)
Potassium: 3.8 mmol/L (ref 3.5–5.1)
Sodium: 138 mmol/L (ref 135–145)

## 2020-11-13 LAB — CBC
HCT: 29.6 % — ABNORMAL LOW (ref 36.0–46.0)
Hemoglobin: 9.6 g/dL — ABNORMAL LOW (ref 12.0–15.0)
MCH: 25.7 pg — ABNORMAL LOW (ref 26.0–34.0)
MCHC: 32.4 g/dL (ref 30.0–36.0)
MCV: 79.1 fL — ABNORMAL LOW (ref 80.0–100.0)
Platelets: 307 10*3/uL (ref 150–400)
RBC: 3.74 MIL/uL — ABNORMAL LOW (ref 3.87–5.11)
RDW: 18.7 % — ABNORMAL HIGH (ref 11.5–15.5)
WBC: 11.7 10*3/uL — ABNORMAL HIGH (ref 4.0–10.5)
nRBC: 0 % (ref 0.0–0.2)

## 2020-11-13 LAB — GLUCOSE, CAPILLARY
Glucose-Capillary: 139 mg/dL — ABNORMAL HIGH (ref 70–99)
Glucose-Capillary: 97 mg/dL (ref 70–99)
Glucose-Capillary: 125 mg/dL — ABNORMAL HIGH (ref 70–99)
Glucose-Capillary: 409 mg/dL — ABNORMAL HIGH (ref 70–99)

## 2020-11-13 LAB — MAGNESIUM: Magnesium: 2.4 mg/dL (ref 1.7–2.4)

## 2020-11-13 SURGERY — INCISION AND DRAINAGE, ABSCESS, PERIRECTAL
Anesthesia: General | Site: Buttocks | Laterality: Right

## 2020-11-13 MED ORDER — LIDOCAINE HCL (CARDIAC) PF 100 MG/5ML IV SOSY
PREFILLED_SYRINGE | INTRAVENOUS | Status: DC | PRN
  Administered 2020-11-13: 100 mg via INTRAVENOUS

## 2020-11-13 MED ORDER — OXYCODONE HCL 5 MG/5ML PO SOLN: 5.0000 mg | Freq: Once | ORAL | Status: DC | PRN

## 2020-11-13 MED ORDER — FENTANYL CITRATE (PF) 100 MCG/2ML IJ SOLN
INTRAMUSCULAR | Status: DC | PRN
  Administered 2020-11-13: 50 ug via INTRAVENOUS

## 2020-11-13 MED ORDER — SUGAMMADEX SODIUM 500 MG/5ML IV SOLN
INTRAVENOUS | Status: DC | PRN
  Administered 2020-11-13: 200 mg via INTRAVENOUS

## 2020-11-13 MED ORDER — LABETALOL HCL 5 MG/ML IV SOLN
INTRAVENOUS | Status: AC
  Filled 2020-11-13: qty 4

## 2020-11-13 MED ORDER — ONDANSETRON HCL 4 MG/2ML IJ SOLN
INTRAMUSCULAR | Status: DC | PRN
  Administered 2020-11-13: 4 mg via INTRAVENOUS

## 2020-11-13 MED ORDER — DEXAMETHASONE SODIUM PHOSPHATE 10 MG/ML IJ SOLN
INTRAMUSCULAR | Status: DC | PRN
  Administered 2020-11-13: 5 mg via INTRAVENOUS

## 2020-11-13 MED ORDER — ROCURONIUM BROMIDE 100 MG/10ML IV SOLN
INTRAVENOUS | Status: DC | PRN
  Administered 2020-11-13: 50 mg via INTRAVENOUS

## 2020-11-13 MED ORDER — BUPIVACAINE LIPOSOME 1.3 % IJ SUSP
INTRAMUSCULAR | Status: AC
  Filled 2020-11-13: qty 20

## 2020-11-13 MED ORDER — HYDRALAZINE HCL 20 MG/ML IJ SOLN: 10.0000 mg | Freq: Four times a day (QID) | INTRAMUSCULAR | Status: DC | PRN

## 2020-11-13 MED ORDER — LABETALOL HCL 5 MG/ML IV SOLN
10.0000 mg | Freq: Once | INTRAVENOUS | Status: AC
  Administered 2020-11-13: 07:00:00 10 mg via INTRAVENOUS
  Filled 2020-11-13: qty 4

## 2020-11-13 MED ORDER — ACETAMINOPHEN 500 MG PO TABS: 1000.0000 mg | ORAL_TABLET | Freq: Four times a day (QID) | ORAL | Status: DC | PRN

## 2020-11-13 MED ORDER — OXYCODONE HCL 5 MG PO TABS
5.0000 mg | ORAL_TABLET | Freq: Four times a day (QID) | ORAL | Status: DC | PRN
  Administered 2020-11-13 – 2020-11-14 (×2): 5 mg via ORAL
  Filled 2020-11-13 (×2): qty 1

## 2020-11-13 MED ORDER — BUPIVACAINE LIPOSOME 1.3 % IJ SUSP
INTRAMUSCULAR | Status: DC | PRN
  Administered 2020-11-13: 20 mL

## 2020-11-13 MED ORDER — ONDANSETRON HCL 4 MG/2ML IJ SOLN: 4.0000 mg | Freq: Once | INTRAMUSCULAR | Status: DC | PRN

## 2020-11-13 MED ORDER — BUPIVACAINE-EPINEPHRINE 0.5% -1:200000 IJ SOLN
INTRAMUSCULAR | Status: DC | PRN
  Administered 2020-11-13: 30 mL

## 2020-11-13 MED ORDER — OXYCODONE HCL 5 MG PO TABS: 5.0000 mg | ORAL_TABLET | Freq: Once | ORAL | Status: DC | PRN

## 2020-11-13 MED ORDER — HYDROMORPHONE HCL 1 MG/ML IJ SOLN: 0.5000 mg | INTRAMUSCULAR | Status: DC | PRN

## 2020-11-13 MED ORDER — FENTANYL CITRATE (PF) 100 MCG/2ML IJ SOLN
INTRAMUSCULAR | Status: AC
  Filled 2020-11-13: qty 2

## 2020-11-13 MED ORDER — PROPOFOL 10 MG/ML IV BOLUS
INTRAVENOUS | Status: DC | PRN
  Administered 2020-11-13: 150 mg via INTRAVENOUS

## 2020-11-13 MED ORDER — LACTATED RINGERS IV SOLN: INTRAVENOUS | Status: DC | PRN

## 2020-11-13 MED ORDER — NYSTATIN 100000 UNIT/GM EX POWD
Freq: Two times a day (BID) | CUTANEOUS | Status: DC
  Filled 2020-11-13: qty 15

## 2020-11-13 MED ORDER — BUPIVACAINE-EPINEPHRINE (PF) 0.5% -1:200000 IJ SOLN
INTRAMUSCULAR | Status: AC
  Filled 2020-11-13: qty 30

## 2020-11-13 MED ORDER — MIDAZOLAM HCL 2 MG/2ML IJ SOLN
INTRAMUSCULAR | Status: AC
  Filled 2020-11-13: qty 2

## 2020-11-13 MED ORDER — LORATADINE 10 MG PO TABS
10.0000 mg | ORAL_TABLET | Freq: Every day | ORAL | Status: DC
  Administered 2020-11-13 – 2020-11-15 (×3): 10 mg via ORAL
  Filled 2020-11-13 (×3): qty 1

## 2020-11-13 MED ORDER — FENTANYL CITRATE (PF) 100 MCG/2ML IJ SOLN: 25.0000 ug | INTRAMUSCULAR | Status: DC | PRN

## 2020-11-13 SURGICAL SUPPLY — 35 items
BLADE SURG 15 STRL LF DISP TIS (BLADE) ×1 IMPLANT
BLADE SURG 15 STRL SS (BLADE) ×2
BRIEF STRETCH MATERNITY 2XLG (MISCELLANEOUS) ×3 IMPLANT
CANISTER SUCT 1200ML W/VALVE (MISCELLANEOUS) IMPLANT
CNTNR SPEC 2.5X3XGRAD LEK (MISCELLANEOUS) ×1
CONT SPEC 4OZ STER OR WHT (MISCELLANEOUS) ×2
CONTAINER SPEC 2.5X3XGRAD LEK (MISCELLANEOUS) ×1 IMPLANT
COVER WAND RF STERILE (DRAPES) IMPLANT
DECANTER SPIKE VIAL GLASS SM (MISCELLANEOUS) IMPLANT
DRAIN PENROSE 1/4X12 LTX STRL (WOUND CARE) IMPLANT
DRAPE LAPAROTOMY 100X77 ABD (DRAPES) ×3 IMPLANT
DRSG GAUZE FLUFF 36X18 (GAUZE/BANDAGES/DRESSINGS) ×3 IMPLANT
GAUZE SPONGE 4X4 12PLY STRL (GAUZE/BANDAGES/DRESSINGS) ×6 IMPLANT
GLOVE SURG SYN 7.0 (GLOVE) ×3 IMPLANT
GLOVE SURG SYN 7.5  E (GLOVE) ×2
GLOVE SURG SYN 7.5 E (GLOVE) ×1 IMPLANT
GOWN STRL REUS W/ TWL LRG LVL3 (GOWN DISPOSABLE) ×2 IMPLANT
GOWN STRL REUS W/TWL LRG LVL3 (GOWN DISPOSABLE) ×4
IV CATH ANGIO 14GX1.88 NO SAFE (IV SOLUTION) IMPLANT
KIT TURNOVER KIT A (KITS) ×3 IMPLANT
LABEL OR SOLS (LABEL) IMPLANT
MANIFOLD NEPTUNE II (INSTRUMENTS) ×3 IMPLANT
NEEDLE HYPO 22GX1.5 SAFETY (NEEDLE) ×3 IMPLANT
NS IRRIG 1000ML POUR BTL (IV SOLUTION) ×3 IMPLANT
NS IRRIG 500ML POUR BTL (IV SOLUTION) IMPLANT
PACK BASIN MINOR (MISCELLANEOUS) ×3 IMPLANT
PAD ABD DERMACEA PRESS 5X9 (GAUZE/BANDAGES/DRESSINGS) ×9 IMPLANT
SOL PREP PVP 2OZ (MISCELLANEOUS) ×3
SOLUTION PREP PVP 2OZ (MISCELLANEOUS) ×1 IMPLANT
SURGILUBE 2OZ TUBE FLIPTOP (MISCELLANEOUS) IMPLANT
SUT ETH BLK MONO 3 0 FS 1 12/B (SUTURE) IMPLANT
SWAB CULTURE AMIES ANAERIB BLU (MISCELLANEOUS) ×3 IMPLANT
SYR 10ML LL (SYRINGE) IMPLANT
SYR 20ML LL LF (SYRINGE) ×3 IMPLANT
SYR BULB IRRIG 60ML STRL (SYRINGE) ×3 IMPLANT

## 2020-11-13 NOTE — Anesthesia Procedure Notes (Signed)
Procedure Name: Intubation Date/Time: 11/13/2020 11:24 AM Performed by: Aline Brochure, CRNA Pre-anesthesia Checklist: Patient identified, Emergency Drugs available, Suction available and Patient being monitored Patient Re-evaluated:Patient Re-evaluated prior to induction Oxygen Delivery Method: Circle system utilized Preoxygenation: Pre-oxygenation with 100% oxygen Induction Type: IV induction Ventilation: Mask ventilation without difficulty Laryngoscope Size: Mac and 3 Grade View: Grade I Tube type: Oral Tube size: 7.0 mm Number of attempts: 1 Airway Equipment and Method: Stylet and Video-laryngoscopy Placement Confirmation: ETT inserted through vocal cords under direct vision,  positive ETCO2 and breath sounds checked- equal and bilateral Secured at: 21 cm Tube secured with: Tape Dental Injury: Teeth and Oropharynx as per pre-operative assessment

## 2020-11-13 NOTE — Progress Notes (Signed)
Physical Therapy Treatment Patient Details Name: Tina Gregory MRN: [id number] DOB: February 02, 1961 Today's Date: 11/13/2020    History of Present Illness Pt is 59 y/o F with PMH: DM, HTN, HLD, diabetic neuropathy, and CVA. Pt brought to [facility] ED on 11/05/20 by family secondary to generalized weakness and a fall while trying to get in a wheelchair in [place] parking lot. Pt admitted d/t community-acquired pneumonia and AKI.    PT Comments    Pt in recliner after bathing and care with tech.  Stated she "walked right over here" and noted improved overall mobility.  She initially declined session due to fatigue but while talking, she stood up from recliner with ease and stood at chairside without walker.  She asks for toilet tissue and begins rubbing wound.  Pt encouraged to only pat dry if necessary but it is best left untouched.  Voiced understanding but continued to attend to it as she wished.  She declined further activity at this time but remained up in recliner.  Awaiting debridement today.  Will address gait tomorrow.  Pt with overall improved mobility and alertness this session.  Anticipate improved gait tomorrow and possible change in discharge recommendations to HHPT given mobility today but will do formal gait assessment tomorrow before changing recommendations.   Follow Up Recommendations  SNF;Other (comment)     Equipment Recommendations  Rolling walker with 5" wheels    Recommendations for Other Services       Precautions / Restrictions Precautions Precautions: Fall    Mobility  Bed Mobility               General bed mobility comments: seated in recliner beginning/end of treatment session  Transfers Overall transfer level: Needs assistance Equipment used: None Transfers: Sit to/from Stand Sit to Stand: Min guard         General transfer comment: stood without UE support today with ease  Ambulation/Gait             General Gait Details: deferred from  fatigue from bathing   Stairs             Wheelchair Mobility    Modified Rankin (Stroke Patients Only)       Balance Overall balance assessment: Needs assistance Sitting-balance support: Feet supported Sitting balance-Leahy Scale: Good     Standing balance support: No upper extremity supported Standing balance-Leahy Scale: Fair                              Cognition   Behavior During Therapy: WFL for tasks assessed/performed Overall Cognitive Status: Within Functional Limits for tasks assessed                                        Exercises      General Comments        Pertinent Vitals/Pain Pain Assessment: Faces Faces Pain Scale: Hurts little more Pain Location: bottom Pain Descriptors / Indicators: Tender Pain Intervention(s): Limited activity within patient's tolerance;Monitored during session;Repositioned    Home Living                      Prior Function            PT Goals (current goals can now be found in the care plan section) Progress towards PT goals: Progressing toward goals  Frequency    Min 2X/week      PT Plan Current plan remains appropriate    Co-evaluation              AM-PAC PT "6 Clicks" Mobility   Outcome Measure  Help needed turning from your back to your side while in a flat bed without using bedrails?: A Little Help needed moving from lying on your back to sitting on the side of a flat bed without using bedrails?: A Little Help needed moving to and from a bed to a chair (including a wheelchair)?: A Little Help needed standing up from a chair using your arms (e.g., wheelchair or bedside chair)?: A Little Help needed to walk in hospital room?: A Little Help needed climbing 3-5 steps with a railing? : A Lot 6 Click Score: 17    End of Session   Activity Tolerance: Patient tolerated treatment well;Patient limited by fatigue   Nurse Communication: Mobility status        Time: 4801-6553 PT Time Calculation (min) (ACUTE ONLY): 8 min  Charges:  $Therapeutic Activity: 8-22 mins                    Danielle Dess, PTA 11/13/20, 9:44 AM

## 2020-11-13 NOTE — Brief Op Note (Signed)
11/13/2020  12:29 PM  PATIENT:  Tina Gregory  59 y.o. female  PRE-OPERATIVE DIAGNOSIS:  right buttocks abscess  POST-OPERATIVE DIAGNOSIS:  Right buttocks abscess  PROCEDURE:  Procedure(s): IRRIGATION AND DEBRIDEMENT BUTTOCKS ABSCESS (Right)  SURGEON:  Surgeon(s) and Role:    * Kiaya Haliburton, MD - Primary  ANESTHESIA:   general  EBL:  10 ml  BLOOD ADMINISTERED:none  DRAINS: none   LOCAL MEDICATIONS USED:  BUPIVICAINE   SPECIMEN:  Source of Specimen:  Culture swab and tissue culture  DISPOSITION OF SPECIMEN:  Micro  COUNTS:  YES  DICTATION: .Dragon Dictation  PLAN OF CARE: continue inpatient admission  PATIENT DISPOSITION:  PACU - hemodynamically stable.   Delay start of Pharmacological VTE agent (>24hrs) due to surgical blood loss or risk of bleeding: yes

## 2020-11-13 NOTE — Progress Notes (Signed)
[facility]  ROUNDING NOTE   Subjective:   Tina Gregory is 59 years old African-American female with history of diabetes mellitus, hypertension, diabetic neuropathy, CVA, hyperlipidemia who was admitted on 11/05/2020 with community-acquired pneumonia and acute kidney injury. Found to have a perirectal abscess  Scheduled for surgery for her abscess later today.   UOP Creatinine 4.03 (4.42).   Objective:  Vital signs in last 24 hours:  Temp:  [97.7 F (36.5 C)-98.3 F (36.8 C)] 98.1 F (36.7 C) (11/28 0833) Pulse Rate:  [62-71] 62 (11/28 0833) Resp:  [16-20] 18 (11/28 0833) BP: (154-189)/(74-95) 178/93 (11/28 0833) SpO2:  [95 %-99 %] 97 % (11/28 0833)  Weight change:  Filed Weights   11/05/20 1423  Weight: 109.3 kg    Intake/Output: I/O last 3 completed shifts: In: 2854 [P.O.:600; I.V.:2098.5; IV Piggyback:155.5] Out: 4600 [Urine:4600]   Intake/Output this shift:  No intake/output data recorded.  Physical Exam: General: Sitting in chair  Head: Normocephalic,atraumatic  Eyes: Sclerae and conjunctivae clear  Lungs:  Diminished bilaterally   Heart: regular  Abdomen:  Soft, nontender, obese  Extremities: No peripheral edema.  Neurologic: Alert,awake, speech clear and appropriate        Basic Metabolic Panel: Recent Labs  Lab 11/09/20 0545 11/09/20 0545 11/10/20 0537 11/10/20 0537 11/11/20 0542 11/12/20 0507 11/13/20 0521  NA 137  --  136  --  138 138 138  K 3.8  --  3.7  --  3.7 3.4* 3.8  CL 106  --  103  --  105 106 104  CO2 19*  --  20*  --  21* 22 24  GLUCOSE 57*  --  58*  --  210* 64* 167*  BUN 52*  --  49*  --  47* 43* 36*  CREATININE 4.91*  --  5.05*  --  4.81* 4.42* 4.03*  CALCIUM 8.8*   < > 9.1   < > 8.8* 8.9 8.9  MG  --   --  2.4  --  2.2  --  2.4  PHOS 6.1*  --   --   --   --   --   --    < > = values in this interval not displayed.    Liver Function Tests: Recent Labs  Lab 11/07/20 0933 11/08/20 0554 11/08/20 1822  11/09/20 0545 11/11/20 0542  AST 107* 93* 126*  --  103*  ALT 62* 55* 64*  --  65*  ALKPHOS 193* 205* 260*  --  372*  BILITOT 1.3* 1.4* 1.9*  --  1.2  PROT 6.8 7.0 7.2  --  7.3  ALBUMIN 2.0* 2.1* 1.9* 1.9* 1.8*   No results for input(s): LIPASE, AMYLASE in the last 168 hours. Recent Labs  Lab 11/07/20 1425  AMMONIA 27    CBC: Recent Labs  Lab 11/07/20 0933 11/08/20 0554 11/09/20 0545 11/10/20 0537 11/11/20 0542 11/12/20 0507 11/13/20 0521  WBC 12.2*   < > 12.5* 12.0* 10.7* 10.7* 11.7*  NEUTROABS 7.9*  --   --   --   --   --   --   HGB 9.5*   < > 9.3* 9.6* 9.2* 9.6* 9.6*  HCT 29.8*   < > 29.0* 28.7* 28.5* 29.5* 29.6*  MCV 81.9   < > 81.9 78.8* 79.8* 79.7* 79.1*  PLT 183   < > 204 246 272 301 307   < > = values in this interval not displayed.    Cardiac Enzymes: No results  for input(s): CKTOTAL, CKMB, CKMBINDEX, TROPONINI in the last 168 hours.  BNP: Invalid input(s): POCBNP  CBG: Recent Labs  Lab 11/12/20 0755 11/12/20 1123 11/12/20 1554 11/12/20 2131 11/13/20 0831  GLUCAP 72 117* 169* 247* 139*    Microbiology: Results for orders placed or performed during the hospital encounter of 11/05/20  Resp Panel by RT-PCR (Flu A&B, Covid) Nasopharyngeal Swab     Status: None   Collection Time: 11/05/20  9:36 PM   Specimen: Nasopharyngeal Swab; Nasopharyngeal(NP) swabs in vial transport medium  Result Value Ref Range Status   SARS Coronavirus 2 by RT PCR NEGATIVE NEGATIVE Final    Comment: (NOTE) SARS-CoV-2 target nucleic acids are NOT DETECTED.  The SARS-CoV-2 RNA is generally detectable in upper respiratory specimens during the acute phase of infection. The lowest concentration of SARS-CoV-2 viral copies this assay can detect is 138 copies/mL. A negative result does not preclude SARS-Cov-2 infection and should not be used as the sole basis for treatment or other patient management decisions. A negative result may occur with  improper specimen  collection/handling, submission of specimen other than nasopharyngeal swab, presence of viral mutation(s) within the areas targeted by this assay, and inadequate number of viral copies(<138 copies/mL). A negative result must be combined with clinical observations, patient history, and epidemiological information. The expected result is Negative.  Fact Sheet for Patients:  BloggerCourse.com  Fact Sheet for Healthcare Providers:  SeriousBroker.it  This test is no t yet approved or cleared by the [place] FDA and  has been authorized for detection and/or diagnosis of SARS-CoV-2 by FDA under an Emergency Use Authorization (EUA). This EUA will remain  in effect (meaning this test can be used) for the duration of the COVID-19 declaration under Section 564(b)(1) of the Act, 21 U.S.C.section 360bbb-3(b)(1), unless the authorization is terminated  or revoked sooner.       Influenza A by PCR NEGATIVE NEGATIVE Final   Influenza B by PCR NEGATIVE NEGATIVE Final    Comment: (NOTE) The Xpert Xpress SARS-CoV-2/FLU/RSV plus assay is intended as an aid in the diagnosis of influenza from Nasopharyngeal swab specimens and should not be used as a sole basis for treatment. Nasal washings and aspirates are unacceptable for Xpert Xpress SARS-CoV-2/FLU/RSV testing.  Fact Sheet for Patients: BloggerCourse.com  Fact Sheet for Healthcare Providers: SeriousBroker.it  This test is not yet approved or cleared by the [place] FDA and has been authorized for detection and/or diagnosis of SARS-CoV-2 by FDA under an Emergency Use Authorization (EUA). This EUA will remain in effect (meaning this test can be used) for the duration of the COVID-19 declaration under Section 564(b)(1) of the Act, 21 U.S.C. section 360bbb-3(b)(1), unless the authorization is terminated or revoked.  Performed at [facility], [address] [place], [place] [address]   Culture, blood (routine x 2) Call MD if unable to obtain prior to antibiotics being given     Status: None   Collection Time: 11/06/20 12:46 AM   Specimen: BLOOD  Result Value Ref Range Status   Specimen Description BLOOD LEFT ANTECUBITAL  Final   Special Requests   Final    BOTTLES DRAWN AEROBIC AND ANAEROBIC Blood Culture adequate volume   Culture   Final    NO GROWTH 5 DAYS Performed at [facility], [address], [place], [place] [address]    Report Status 11/11/2020 FINAL  Final  Culture, blood (routine x 2) Call MD if unable to obtain prior to antibiotics being given  Status: None   Collection Time: 11/06/20 12:46 AM   Specimen: BLOOD  Result Value Ref Range Status   Specimen Description BLOOD BLOOD LEFT FOREARM  Final   Special Requests   Final    BOTTLES DRAWN AEROBIC AND ANAEROBIC Blood Culture adequate volume   Culture   Final    NO GROWTH 5 DAYS Performed at [facility], [address], [place], [place] [address]    Report Status 11/11/2020 FINAL  Final  Urine Culture     Status: None   Collection Time: 11/08/20  9:11 AM   Specimen: Urine, Random  Result Value Ref Range Status   Specimen Description   Final    URINE, RANDOM Performed at [facility], [address] [place], [place] [address]    Special Requests   Final    NONE Performed at [facility], [address], [place], [place] [address]    Culture   Final    NO GROWTH Performed at [facility], [address], [place], [place] [address]    Report Status 11/09/2020 FINAL  Final    Coagulation Studies: No results for input(s): LABPROT, INR in the last 72 hours.  Urinalysis: No results for input(s): COLORURINE, LABSPEC, PHURINE, GLUCOSEU, HGBUR, BILIRUBINUR, KETONESUR, PROTEINUR, UROBILINOGEN, NITRITE, LEUKOCYTESUR in the last 72 hours.  Invalid input(s): APPERANCEUR    Imaging: No  results found.   Medications:   . [MAR Hold] ampicillin-sulbactam (UNASYN) IV 3 g (11/13/20 0342)  . sodium bicarbonate in D5W 1000 mL infusion 50 mL/hr at 11/12/20 1805   . [MAR Hold] amitriptyline  10 mg Oral QHS  . [MAR Hold] aspirin EC  81 mg Oral Daily  . [MAR Hold] carvedilol  25 mg Oral BID  . [MAR Hold] Chlorhexidine Gluconate Cloth  6 each Topical Daily  . [MAR Hold] cloNIDine  0.2 mg Oral BID  . [MAR Hold] clopidogrel  75 mg Oral Daily  . [MAR Hold] feeding supplement  237 mL Oral TID with meals  . [MAR Hold] heparin injection (subcutaneous)  5,000 Units Subcutaneous Q8H  . [MAR Hold] insulin aspart  0-15 Units Subcutaneous TID WC  . [MAR Hold] insulin aspart  0-5 Units Subcutaneous QHS  . [MAR Hold] insulin aspart  2 Units Subcutaneous TID WC  . [MAR Hold] insulin detemir  15 Units Subcutaneous BID  . [MAR Hold] levothyroxine  25 mcg Oral Daily  . [MAR Hold] multivitamin with minerals  1 tablet Oral Daily  . [MAR Hold] nicotine  21 mg Transdermal Daily  . [MAR Hold] saccharomyces boulardii  250 mg Oral BID   [MAR Hold] acetaminophen, [MAR Hold] dextrose, [MAR Hold] guaiFENesin-dextromethorphan, [MAR Hold] hydrALAZINE, [MAR Hold] loperamide, [MAR Hold] traMADol  Assessment/ Plan:  Tina Gregory is a 59 y.o.  female with history of diabetes mellitus, hypertension, diabetic neuropathy, CVA, hyperlipidemia who was admitted on 11/05/2020 with community-acquired pneumonia and acute kidney injury.  # Acute kidney injury with proteinuria Baseline creatinine unknown. Creatinine on admission of 2.96, GFR of 18.  Lab Results  Component Value Date   CREATININE 4.03 (H) 11/13/2020   CREATININE 4.42 (H) 11/12/2020   CREATININE 4.81 (H) 11/11/2020   No acute indication for dialysis. Kidney function is improving. Nonoliguric urine output.  Will continue IVF sodium bicarbonate 50 ml/hr  #Metabolic Acidosis   Continue Sodium bicarbonate infusion 50 ml/hr  #Diabetes type II  with CKD Lab Results  Component Value Date   HGBA1C 10.8 (H) 11/06/2020  Intermittent hypoglycemic episodes during this admission Not well controlled.   #Hypertension Blood pressure remains elevated.  Continue clonidine and Carvedilol Discussed starting amlodipine post surgery with patient.    S 8 Garrette Caine 11/28/202111:18 AM

## 2020-11-13 NOTE — Progress Notes (Signed)
11/13/2020  Subjective: No acute events overnight.  Patient scheduled for surgery today for debridement of right buttocks abscess.  WBC mildly elevated to 11.7 compared to 10.7 yesterday.  Vital signs: Temp:  [97.4 F (36.3 C)-98.3 F (36.8 C)] 97.4 F (36.3 C) (11/28 1237) Pulse Rate:  [62-71] 67 (11/28 1237) Resp:  [16-20] 17 (11/28 1237) BP: (154-189)/(86-95) 179/92 (11/28 1237) SpO2:  [92 %-99 %] 92 % (11/28 1237)   Intake/Output: 11/27 0701 - 11/28 0700 In: 1955.2 [P.O.:600; I.V.:1199.7; IV Piggyback:155.5] Out: 3150 [Urine:3150] Last BM Date: 11/10/20  Physical Exam: Constitutional: No acute distress Skin: Patient continues having right buttocks abscess which is about the same appears as yesterday with no active purulent drainage but still serosapurulent drainage on the gauze pad and bed sheets.  Labs:  Recent Labs    11/12/20 0507 11/13/20 0521  WBC 10.7* 11.7*  HGB 9.6* 9.6*  HCT 29.5* 29.6*  PLT 301 307   Recent Labs    11/12/20 0507 11/13/20 0521  NA 138 138  K 3.4* 3.8  CL 106 104  CO2 22 24  GLUCOSE 64* 167*  BUN 43* 36*  CREATININE 4.42* 4.03*  CALCIUM 8.9 8.9   No results for input(s): LABPROT, INR in the last 72 hours.  Imaging: No results found.  Assessment/Plan: This is a 59 y.o. female with a right buttocks abscess.  -We will take the patient today to the operating room for debridement and drainage procedure of the right buttocks abscess.  Given the proximity to the anal verge, will do wet-to-dry dressing changes postop rather than attempt a wound VAC.   Howie Ill, MD [facility]

## 2020-11-13 NOTE — Anesthesia Postprocedure Evaluation (Signed)
Anesthesia Post Note  Patient: Anquinette Pierro  Procedure(s) Performed: IRRIGATION AND DEBRIDEMENT PERIRECTAL ABSCESS (Right Buttocks)  Patient location during evaluation: PACU Anesthesia Type: General Level of consciousness: awake and alert Pain management: pain level controlled Vital Signs Assessment: post-procedure vital signs reviewed and stable Respiratory status: spontaneous breathing, nonlabored ventilation and respiratory function stable Cardiovascular status: blood pressure returned to baseline and stable Postop Assessment: no apparent nausea or vomiting Anesthetic complications: no   No complications documented.   Last Vitals:  Vitals:   11/13/20 1308 11/13/20 1516  BP: (!) 183/92 (!) 181/90  Pulse: 64 64  Resp: 17 20  Temp: (!) 36.2 C 36.8 C  SpO2: 96% 94%    Last Pain:  Vitals:   11/13/20 1308  TempSrc:   PainSc: 0-No pain                 Aurelio Brash Giacomo Valone

## 2020-11-13 NOTE — Progress Notes (Addendum)
PROGRESS NOTE  Tina Gregory ZOX:[id number] DOB: [birth year] DOA: 11/05/2020 PCP: Pcp, No   LOS: 8 days   Brief narrative: As per HPI,  Tina McGeeis a 59 y.o.femalewith medical history significant ofdiabetes, hypertension, diabetic neuropathy, history of CVA, hyperlipidemia was brought in to the ED on 11/05/20 for generalized weakness and a fall while trying to get in a wheelchair in [place] parking lot.      Evaluation in the emergency department revealed a leukocytosis with low-grade fever, tachycardia tachypnea.  Her creatinine level was elevated at 2.9.  Chest x-ray showed bibasilar opacities concerning for pneumonia.  X-ray of the left knee was unremarkable.  Patient was then admitted to hospital for community-acquired pneumonia and acute kidney injury.    Patient did have urinary retention and required Foley catheter.  Nephrology was consulted during hospitalization.  Patient still has significantly elevated creatinine and acute kidney injury but is trending down..  Assessment/Plan:  Principal Problem:   CAP (community acquired pneumonia) Active Problems:   AKI (acute kidney injury) (HCC)   Diabetes (HCC)   Essential hypertension   Hyperlipemia   Cellulitis and abscess of buttock  Severe sepsis due to community-acquired pneumonia -completed 5-day course of antibiotic with Rocephin and Zithromax.  Currently on room air.  Improved.  Mild hypokalemia.  Improved with replacement.  Acute urinary retention -with acute kidney injury, continue Foley catheter for now.  We will try voiding trial when renal function continues to improve.  Acute kidney injury with proteinuria, with metabolic acidosis.  Nephrology on board. Continue to hold Lasix and losartan.  Currently on sodium bicarb drip.   Continue strict intake and output charting.  Trending down creatinine levels.  Hold PPI.  Still on a Foley catheter.  Lab Results  Component Value Date   CREATININE 4.03 (H) 11/13/2020    CREATININE 4.42 (H) 11/12/2020   CREATININE 4.81 (H) 11/11/2020    Acute metabolic encephalopathy -resolved.  Likely multifactorial from sedatives, infection and renal failure.  Improved at this time.  Arterial blood gas without hypercarbia.  CT head scan was negative.  Ammonia levels were normal as well.  MRI showed chronic microvascular changes.  Has been treated pneumonia.    Elevated LFTs.-Unclear.  Right upper quadrant ultrasound showed increased echogenicity.  Slightly irregular.  Cirrhosis could not be excluded.  Will need outpatient follow-up for LFTs.   Likely OSA  -will benefit from outpatient sleep study.  ABG without hypercarbia.  Type 2 diabetes with neuropathy -patient is on Levemir 35 units BID, Novolog 20-25 units TID WC and Trulicity at home.  On decreased dose of long-acting and sliding scale insulin at this time due to episodes of hypoglycemia.  Essential hypertension   Coreg, clonidine, Lasix and losartan at home.  Continue to hold losartan and Lasix due to AKI.  Continue Coreg and clonidine.  On PRN hydralazine p.o. 10 mg every 8 hourly for now.  Currently n.p.o. so no p.o. meds been given.  Blood pressure appears to be high  Hx of CVA   continue aspirin and Plavix.  MRI of the brain was negative for acute findings.  No focal findings  Depression / Anxiety - continue Elavil but at a decreased dose.  Currently on 10 mg.  Was on 50 mg at home.  Hypothyroidism - continue levothyroxine  Gout -hold allopurinol due to acute kidney injury  Morbid obesity: Body mass index is 40.1 kg/m.    Would benefit from lifestyle modification to lose weight as outpatient.  DVT prophylaxis:  Heparin subcu  Gluteal abscess General surgery has seen the patient.  Plan for I&D today.  Has been started on IV Unasyn.  Code Status: Full code  Family Communication:   None today.    Status is: Inpatient  Remains inpatient appropriate because:IV treatments appropriate due to  intensity of illness or inability to take PO, Inpatient level of care appropriate due to severity of illness and Likely need for skilled nursing facility placement, IV antibiotics, acute kidney injury, gluteal abscess needing I&D   Dispo: The patient is from: Home              Anticipated d/c is to: SNF as per physical therapy recommendations but patient is thinking about home with home health on discharge.              Anticipated d/c date is: 2 to 3 days              Patient currently is not medically stable to d/c.  Consultants:  Nephrology  Neurology  General surgery   Procedures:  Foley catheter placement  Antibiotics:  . Rocephin and Zithromax 11/20>11/25 . Unasyn >11/12/2020  Subjective:  Today, patient was seen and examined at bedside.  Complains of  perianal discomfort.  No fever chills nausea vomiting shortness of breath.   Objective: Vitals:   11/13/20 0519 11/13/20 0833  BP: (!) 189/90 (!) 178/93  Pulse: 67 62  Resp: 17 18  Temp: 98.3 F (36.8 C) 98.1 F (36.7 C)  SpO2: 96% 97%    Intake/Output Summary (Last 24 hours) at 11/13/2020 0933 Last data filed at 11/13/2020 0400 Gross per 24 hour  Intake 1955.23 ml  Output 3150 ml  Net -1194.77 ml   Filed Weights   11/05/20 1423  Weight: 109.3 kg   Body mass index is 40.1 kg/m.   Physical Exam: General:  Average built, not in obvious distress, morbidly obese HENT:   No scleral pallor or icterus noted. Oral mucosa is moist.  Chest:  Clear breath sounds.  Diminished breath sounds bilaterally. No crackles or wheezes.  CVS: S1 &S2 heard. No murmur.  Regular rate and rhythm. Abdomen: Soft, nontender, nondistended.  Bowel sounds are heard.  Foley catheter in place.  Right gluteal abscess. Extremities: No cyanosis, clubbing or edema.  Peripheral pulses are palpable. Psych: Alert, awake and oriented, normal mood CNS:  No cranial nerve deficits.  Power equal in all extremities.   Skin: Warm and dry.   Gluteal abscess   Data Review: I have personally reviewed the following laboratory data and studies,  CBC: Recent Labs  Lab 11/07/20 0933 11/08/20 0554 11/09/20 0545 11/10/20 0537 11/11/20 0542 11/12/20 0507 11/13/20 0521  WBC 12.2*   < > 12.5* 12.0* 10.7* 10.7* 11.7*  NEUTROABS 7.9*  --   --   --   --   --   --   HGB 9.5*   < > 9.3* 9.6* 9.2* 9.6* 9.6*  HCT 29.8*   < > 29.0* 28.7* 28.5* 29.5* 29.6*  MCV 81.9   < > 81.9 78.8* 79.8* 79.7* 79.1*  PLT 183   < > 204 246 272 301 307   < > = values in this interval not displayed.   Basic Metabolic Panel: Recent Labs  Lab 11/09/20 0545 11/10/20 0537 11/11/20 0542 11/12/20 0507 11/13/20 0521  NA 137 136 138 138 138  K 3.8 3.7 3.7 3.4* 3.8  CL 106 103 105 106 104  CO2 19* 20* 21* 22 24  GLUCOSE 57* 58* 210* 64* 167*  BUN 52* 49* 47* 43* 36*  CREATININE 4.91* 5.05* 4.81* 4.42* 4.03*  CALCIUM 8.8* 9.1 8.8* 8.9 8.9  MG  --  2.4 2.2  --  2.4  PHOS 6.1*  --   --   --   --    Liver Function Tests: Recent Labs  Lab 11/07/20 0933 11/08/20 0554 11/08/20 1822 11/09/20 0545 11/11/20 0542  AST 107* 93* 126*  --  103*  ALT 62* 55* 64*  --  65*  ALKPHOS 193* 205* 260*  --  372*  BILITOT 1.3* 1.4* 1.9*  --  1.2  PROT 6.8 7.0 7.2  --  7.3  ALBUMIN 2.0* 2.1* 1.9* 1.9* 1.8*   No results for input(s): LIPASE, AMYLASE in the last 168 hours. Recent Labs  Lab 11/07/20 1425  AMMONIA 27   Cardiac Enzymes: No results for input(s): CKTOTAL, CKMB, CKMBINDEX, TROPONINI in the last 168 hours. BNP (last 3 results) No results for input(s): BNP in the last 8760 hours.  ProBNP (last 3 results) No results for input(s): PROBNP in the last 8760 hours.  CBG: Recent Labs  Lab 11/12/20 0755 11/12/20 1123 11/12/20 1554 11/12/20 2131 11/13/20 0831  GLUCAP 72 117* 169* 247* 139*   Recent Results (from the past 240 hour(s))  Resp Panel by RT-PCR (Flu A&B, Covid) Nasopharyngeal Swab     Status: None   Collection Time: 11/05/20  9:36 PM     Specimen: Nasopharyngeal Swab; Nasopharyngeal(NP) swabs in vial transport medium  Result Value Ref Range Status   SARS Coronavirus 2 by RT PCR NEGATIVE NEGATIVE Final    Comment: (NOTE) SARS-CoV-2 target nucleic acids are NOT DETECTED.  The SARS-CoV-2 RNA is generally detectable in upper respiratory specimens during the acute phase of infection. The lowest concentration of SARS-CoV-2 viral copies this assay can detect is 138 copies/mL. A negative result does not preclude SARS-Cov-2 infection and should not be used as the sole basis for treatment or other patient management decisions. A negative result may occur with  improper specimen collection/handling, submission of specimen other than nasopharyngeal swab, presence of viral mutation(s) within the areas targeted by this assay, and inadequate number of viral copies(<138 copies/mL). A negative result must be combined with clinical observations, patient history, and epidemiological information. The expected result is Negative.  Fact Sheet for Patients:  BloggerCourse.comhttps://www.fda.gov/media/152166/download  Fact Sheet for Healthcare Providers:  SeriousBroker.ithttps://www.fda.gov/media/152162/download  This test is no t yet approved or cleared by the [place] FDA and  has been authorized for detection and/or diagnosis of SARS-CoV-2 by FDA under an Emergency Use Authorization (EUA). This EUA will remain  in effect (meaning this test can be used) for the duration of the COVID-19 declaration under Section 564(b)(1) of the Act, 21 U.S.C.section 360bbb-3(b)(1), unless the authorization is terminated  or revoked sooner.       Influenza A by PCR NEGATIVE NEGATIVE Final   Influenza B by PCR NEGATIVE NEGATIVE Final    Comment: (NOTE) The Xpert Xpress SARS-CoV-2/FLU/RSV plus assay is intended as an aid in the diagnosis of influenza from Nasopharyngeal swab specimens and should not be used as a sole basis for treatment. Nasal washings and aspirates are  unacceptable for Xpert Xpress SARS-CoV-2/FLU/RSV testing.  Fact Sheet for Patients: BloggerCourse.comhttps://www.fda.gov/media/152166/download  Fact Sheet for Healthcare Providers: SeriousBroker.ithttps://www.fda.gov/media/152162/download  This test is not yet approved or cleared by the [place] FDA and has been authorized for detection and/or diagnosis of SARS-CoV-2 by FDA under an Emergency Use Authorization (  EUA). This EUA will remain in effect (meaning this test can be used) for the duration of the COVID-19 declaration under Section 564(b)(1) of the Act, 21 U.S.C. section 360bbb-3(b)(1), unless the authorization is terminated or revoked.  Performed at [facility], [address] [place], [place] [address]   Culture, blood (routine x 2) Call MD if unable to obtain prior to antibiotics being given     Status: None   Collection Time: 11/06/20 12:46 AM   Specimen: BLOOD  Result Value Ref Range Status   Specimen Description BLOOD LEFT ANTECUBITAL  Final   Special Requests   Final    BOTTLES DRAWN AEROBIC AND ANAEROBIC Blood Culture adequate volume   Culture   Final    NO GROWTH 5 DAYS Performed at [facility], [address] [place], [place] [address]    Report Status 11/11/2020 FINAL  Final  Culture, blood (routine x 2) Call MD if unable to obtain prior to antibiotics being given     Status: None   Collection Time: 11/06/20 12:46 AM   Specimen: BLOOD  Result Value Ref Range Status   Specimen Description BLOOD BLOOD LEFT FOREARM  Final   Special Requests   Final    BOTTLES DRAWN AEROBIC AND ANAEROBIC Blood Culture adequate volume   Culture   Final    NO GROWTH 5 DAYS Performed at [facility], [address] [place], [place] [address]    Report Status 11/11/2020 FINAL  Final  Urine Culture     Status: None   Collection Time: 11/08/20  9:11 AM   Specimen: Urine, Random  Result Value Ref Range Status   Specimen Description   Final    URINE, RANDOM Performed at  [facility], [address], [place], [place] [address]    Special Requests   Final    NONE Performed at [facility], [address] [place], [place] [address]    Culture   Final    NO GROWTH Performed at [facility], [address] [place], [place] [address]    Report Status 11/09/2020 FINAL  Final     Studies: No results found.    Joycelyn Das, MD  [facility] 11/13/2020

## 2020-11-13 NOTE — Transfer of Care (Signed)
Immediate Anesthesia Transfer of Care Note  Patient: Tina Gregory  Procedure(s) Performed: IRRIGATION AND DEBRIDEMENT PERIRECTAL ABSCESS (Right Buttocks)  Patient Location: PACU  Anesthesia Type:General  Level of Consciousness: awake  Airway & Oxygen Therapy: Patient Spontanous Breathing  Post-op Assessment: Post -op Vital signs reviewed and stable  Post vital signs: stable  Last Vitals:  Vitals Value Taken Time  BP 179/92   Temp    Pulse 68 11/13/20 1236  Resp 16 11/13/20 1236  SpO2 92 % 11/13/20 1236  Vitals shown include unvalidated device data.  Last Pain:  Vitals:   11/13/20 0918  TempSrc:   PainSc: 0-No pain         Complications: No complications documented.

## 2020-11-13 NOTE — Op Note (Signed)
  Procedure Date:  11/13/2020  Pre-operative Diagnosis: Right buttocks abscess  Post-operative Diagnosis: Right buttocks abscess  Procedure: Srainage of right buttocks abscess with debridement of skin and subcutaneous tissue for an area of 7 x 4 cm  Surgeon:  Howie Ill, MD  Anesthesia:  General endotracheal  Estimated Blood Loss: 10 ml  Specimens: Culture swab and tissue for culture  Complications: None  Indications for Procedure:  This is a 59 y.o. female with diagnosis of right buttocks abscess, requiring drainage procedure.  The risks of bleeding, abscess or infection, injury to surrounding structures, and need for further procedures were all discussed with the patient and was willing to proceed.  Description of Procedure: The patient was correctly identified in the preoperative area and brought into the operating room.  The patient was placed supine with VTE prophylaxis in place.  Appropriate time-outs were performed.  Anesthesia was induced and the patient was intubated.  Appropriate antibiotics were infused.  The patient was then placed in prone position  The patient's right buttocks and perianal areas were prepped and draped in usual sterile fashion.  An area of fluctuance was identified and cautery was used to incise this.  Culture swab was used to obtain cultures of the cavity.  Then using cautery, we did excisional debridement of the skin and subcutaneous tissue surrounding the abscess for a total area of 7 x 4 cm.  The distal end of the abscess cavity was about 2 cm from the anal verge.  The cavity did not track to the anal verge for the perianal region and was only limited to the skin and superficial subcutaneous tissue.  Hemostasis was assured with cautery.  The area was then thoroughly irrigated and then dressed with wet-to-dry gauze covered with ABD pad and tape.  The patient was then placed back into supine position.  It was noted that patient had areas of excoriation  in the in bilateral inframammary folds consistent with yeast infection.  The areas were cleansed with gauze and saline and dressed with an ABD pad.  I will order nystatin powder in the postoperative orders.  The patient was then emerged from anesthesia, extubated, and brought to the recovery room for further management.  The patient tolerated the procedure well and all counts were correct at the end of the case.   Howie Ill, MD

## 2020-11-13 NOTE — Consult Note (Signed)
WOC consulted for ITD from moisture; orders updated for use of antimicrobial wicking fabric.    Tina Gregory [facility], CNS, [employer] [phone]

## 2020-11-13 NOTE — Progress Notes (Signed)
Patient to OR. Rai Sinagra S, RN  

## 2020-11-14 ENCOUNTER — Encounter: Payer: Self-pay | Admitting: Surgery

## 2020-11-14 DIAGNOSIS — I10 Essential (primary) hypertension: Secondary | ICD-10-CM | POA: Diagnosis not present

## 2020-11-14 DIAGNOSIS — E1142 Type 2 diabetes mellitus with diabetic polyneuropathy: Secondary | ICD-10-CM | POA: Diagnosis not present

## 2020-11-14 DIAGNOSIS — J189 Pneumonia, unspecified organism: Secondary | ICD-10-CM | POA: Diagnosis not present

## 2020-11-14 DIAGNOSIS — N179 Acute kidney failure, unspecified: Secondary | ICD-10-CM | POA: Diagnosis not present

## 2020-11-14 LAB — BASIC METABOLIC PANEL
Anion gap: 13 (ref 5–15)
BUN: 37 mg/dL — ABNORMAL HIGH (ref 6–20)
CO2: 22 mmol/L (ref 22–32)
Calcium: 8.9 mg/dL (ref 8.9–10.3)
Chloride: 99 mmol/L (ref 98–111)
Creatinine, Ser: 3.58 mg/dL — ABNORMAL HIGH (ref 0.44–1.00)
GFR, Estimated: 14 mL/min — ABNORMAL LOW (ref >60)
Glucose, Bld: 355 mg/dL — ABNORMAL HIGH (ref 70–99)
Potassium: 4.1 mmol/L (ref 3.5–5.1)
Sodium: 134 mmol/L — ABNORMAL LOW (ref 135–145)

## 2020-11-14 LAB — CBC
HCT: 29.4 % — ABNORMAL LOW (ref 36.0–46.0)
Hemoglobin: 9.5 g/dL — ABNORMAL LOW (ref 12.0–15.0)
MCH: 26.1 pg (ref 26.0–34.0)
MCHC: 32.3 g/dL (ref 30.0–36.0)
MCV: 80.8 fL (ref 80.0–100.0)
Platelets: 311 10*3/uL (ref 150–400)
RBC: 3.64 MIL/uL — ABNORMAL LOW (ref 3.87–5.11)
RDW: 19.1 % — ABNORMAL HIGH (ref 11.5–15.5)
WBC: 13.9 10*3/uL — ABNORMAL HIGH (ref 4.0–10.5)
nRBC: 0 % (ref 0.0–0.2)

## 2020-11-14 LAB — GLUCOSE, CAPILLARY
Glucose-Capillary: 230 mg/dL — ABNORMAL HIGH (ref 70–99)
Glucose-Capillary: 292 mg/dL — ABNORMAL HIGH (ref 70–99)
Glucose-Capillary: 295 mg/dL — ABNORMAL HIGH (ref 70–99)
Glucose-Capillary: 315 mg/dL — ABNORMAL HIGH (ref 70–99)
Glucose-Capillary: 366 mg/dL — ABNORMAL HIGH (ref 70–99)

## 2020-11-14 LAB — MAGNESIUM: Magnesium: 2.3 mg/dL (ref 1.7–2.4)

## 2020-11-14 MED ORDER — LOSARTAN POTASSIUM 25 MG PO TABS
25.0000 mg | ORAL_TABLET | Freq: Every day | ORAL | Status: DC
  Administered 2020-11-14 – 2020-11-15 (×2): 25 mg via ORAL
  Filled 2020-11-14 (×2): qty 1

## 2020-11-14 MED ORDER — ENSURE MAX PROTEIN PO LIQD: 11.0000 [oz_av] | Freq: Every day | ORAL | Status: DC

## 2020-11-14 MED ORDER — HYDRALAZINE HCL 50 MG PO TABS
25.0000 mg | ORAL_TABLET | Freq: Three times a day (TID) | ORAL | Status: DC
  Administered 2020-11-14 – 2020-11-15 (×5): 25 mg via ORAL
  Filled 2020-11-14 (×5): qty 1

## 2020-11-14 MED ORDER — HYDROCERIN EX CREA
TOPICAL_CREAM | Freq: Two times a day (BID) | CUTANEOUS | Status: DC
  Filled 2020-11-14: qty 113

## 2020-11-14 MED ORDER — HYDROCORTISONE 1 % EX CREA
TOPICAL_CREAM | Freq: Two times a day (BID) | CUTANEOUS | Status: DC
  Filled 2020-11-14: qty 28

## 2020-11-14 NOTE — Progress Notes (Signed)
Occupational Therapy Treatment Patient Details Name: Tina Gregory MRN: [id number] DOB: Oct 03, 1961 Today's Date: 11/14/2020    History of present illness Pt is 59 y/o F with PMH: DM, HTN, HLD, diabetic neuropathy, and CVA. Pt brought to [facility] ED on 11/05/20 by family secondary to generalized weakness and a fall while trying to get in a wheelchair in [place] parking lot. Pt admitted d/t community-acquired pneumonia and AKI.   OT comments  Pt seen for OT tx to f/u re: safety with ADLs/ADL mobility. OT engages pt in seated and standing g/h tasks with SUPV/SETUP with pt demonstrating improved balance and standing tolerance with RW sink-side to complete 1 g/h task and then complete fxl mobility to nursing station and back. Pt takes 2 standing rest breaks with OT prompting one noting fatigue, but pt does not require seated break and tolerates well. OT will continue to follow. Anticipate pt's d/c recommendation can be updated to [facility] based on improved mentation, ADL transfer safety, standing tolerance, strength, and safety awareness. Will continue to follow.   Follow Up Recommendations  Home health OT;Supervision - Intermittent (SUPV for fxl mobility)    Equipment Recommendations  3 in 1 bedside commode;Tub/shower seat;Other (comment) (2WW)    Recommendations for Other Services      Precautions / Restrictions Precautions Precautions: Fall Restrictions Weight Bearing Restrictions: No Other Position/Activity Restrictions: dizziness       Mobility Bed Mobility               General bed mobility comments: seated in recliner beginning/end of treatment session  Transfers Overall transfer level: Needs assistance Equipment used: Rolling walker (2 wheeled) Transfers: Sit to/from Stand Sit to Stand: Modified independent (Device/Increase time);Supervision         General transfer comment: stood with ease this date, improved control    Balance Overall balance assessment: Needs  assistance Sitting-balance support: Feet supported Sitting balance-Leahy Scale: Good     Standing balance support: Bilateral upper extremity supported Standing balance-Leahy Scale: Good Standing balance comment: fair w/o UE support, G with support                           ADL either performed or assessed with clinical judgement   ADL Overall ADL's : Needs assistance/impaired     Grooming: Set up;Supervision/safety;Standing;Wash/dry hands (with RW sink-side) Grooming Details (indicate cue type and reason): improved capacity to complete seated adn standing ADL g/h tasks                             Functional mobility during ADLs: Min guard;Supervision/safety;Rolling walker (to/from nurses station with no LOB/knee buckling with RW with MIN cues for safety/obstacle avoidance. Much improved over last treatment.)       Vision Patient Visual Report: No change from baseline     Perception     Praxis      Cognition Arousal/Alertness: Awake/alert Behavior During Therapy: WFL for tasks assessed/performed Overall Cognitive Status: Within Functional Limits for tasks assessed                                 General Comments: much improved mentation this date, aware of time, place and situation.        Exercises Other Exercises Other Exercises: OT engages pt in seated and standing g/h tasks with SUPV/SETUP with pt demonstrating improved balance and standing  tolerance with RW sink-side to complete 1 g/h task and then complete fxl mobility to nursing station and back. Pt takes 2 standing rest breaks with OT prompting one noting fatigue, but pt does not require seated break and tolerates well. OT will continue to follow. Anticipate pt's d/c recommendation can be updated to [facility] based on improved mentation, ADL transfer safety, standing tolerance, strength, and safety awareness.   Shoulder Instructions       General Comments      Pertinent Vitals/  Pain       Pain Assessment: Faces Faces Pain Scale: Hurts a little bit Pain Location: bottom Pain Descriptors / Indicators: Tender Pain Intervention(s): Monitored during session;Repositioned  Home Living                                          Prior Functioning/Environment              Frequency  Min 1X/week        Progress Toward Goals  OT Goals(current goals can now be found in the care plan section)  Progress towards OT goals: Progressing toward goals  Acute Rehab OT Goals Patient Stated Goal: to go home OT Goal Formulation: With patient Time For Goal Achievement: 11/21/20 Potential to Achieve Goals: Good  Plan Discharge plan needs to be updated;Frequency remains appropriate    Co-evaluation                 AM-PAC OT "6 Clicks" Daily Activity     Outcome Measure   Help from another person eating meals?: None Help from another person taking care of personal grooming?: A Little Help from another person toileting, which includes using toliet, bedpan, or urinal?: A Little Help from another person bathing (including washing, rinsing, drying)?: A Little Help from another person to put on and taking off regular upper body clothing?: None Help from another person to put on and taking off regular lower body clothing?: A Little 6 Click Score: 20    End of Session Equipment Utilized During Treatment: Gait belt;Rolling walker  OT Visit Diagnosis: Unsteadiness on feet (R26.81);Muscle weakness (generalized) (M62.81)   Activity Tolerance Patient tolerated treatment well;Patient limited by fatigue   Patient Left in chair;with call bell/phone within reach;with chair alarm set   Nurse Communication Mobility status;Other (comment) (notified RN that infusion complete on IV)        Time: 1428-1510 OT Time Calculation (min): 42 min  Charges: OT General Charges $OT Visit: 1 Visit OT Treatments $Self Care/Home Management : 8-22  mins $Therapeutic Activity: 23-37 mins  Rejeana Brock, MS, OTR/L ascom [phone] 11/14/20, 4:53 PM

## 2020-11-14 NOTE — Progress Notes (Signed)
Nutrition Follow-Up Note   DOCUMENTATION CODES:   Morbid obesity  INTERVENTION:   Ensure Max protein supplement daily, each supplement provides 150kcal and 30g of protein.  MVI daily   NUTRITION DIAGNOSIS:   Inadequate oral intake related to lethargy/confusion as evidenced by meal completion < 50%. -resolving   GOAL:   Patient will meet greater than or equal to 90% of their needs -progressing   MONITOR:   PO intake, Supplement acceptance, Labs, Weight trends, I & O's  ASSESSMENT:   59 year old female with PMHx of DM, HTN, HLD, CVA admitted with sepsis, PNA, AKI.   Pt s/p I & D buttocks 11/28  Met with pt in room today. Pt reports improved appetite and oral intake in hospital; pt reports eating 90-100% of meals. Pt's lunch tray was sitting on her side table today with only a few pieces of pizza crust left on it. Pt reports that she has not been drinking the Ensure as her blood sugars have been elevated. RD discussed with pt the importance of adequate nutrition needed to preserve lean muscle and support wound healing. Pt is willing to drink one Ensure supplement (strawberry or vanilla) per day as she reports that she is getting tired of them; RD will add Ensure Max in setting of hyperglycemia. No new weight since admit; RD will request weekly weights.   Medications reviewed and include: aspirin, plavix, heparin, insulin, synthroid, florastor, unasyn, Na bicarbonate  Labs reviewed: Na 134(L), BUN 37(H), creat 3.58(H), Mg 2.3 wnl Wbc- 13.9(H), Hgb 9.5(L), Hct 29.4(L) cbgs- 366, 315, 292 x 24 hrs  Diet Order:   Diet Order            Diet Carb Modified Fluid consistency: Thin; Room service appropriate? Yes  Diet effective now                EDUCATION NEEDS:   No education needs have been identified at this time  Skin:  Skin Assessment: Reviewed RN Assessment (R gluteal abscess 7 x 4 cm)  Last BM:  11/25- type 6  Height:   Ht Readings from Last 1 Encounters:   11/05/20 _0  (1.651 m)   Weight:   Wt Readings from Last 1 Encounters:  11/05/20 109.3 kg   BMI:  Body mass index is 40.1 kg/m.  Estimated Nutritional Needs:   Kcal:  2200-2400  Protein:  110-120 grams  Fluid:  2.2 L/day  Koleen Distance MS, RD, LDN Please refer to [facility] for RD and/or RD on-call/weekend/after hours pager

## 2020-11-14 NOTE — Progress Notes (Addendum)
PROGRESS NOTE  Tresea Malleggy Hustead ZOX:[id number] DOB: 10/30/1961 DOA: 11/05/2020 PCP: Pcp, No   LOS: 9 days   Brief narrative: As per HPI,  Rondalyn McGeeis a 59 y.o.femalewith medical history significant ofdiabetes, hypertension, diabetic neuropathy, history of CVA, hyperlipidemia was brought in to the ED on 11/05/20 for generalized weakness and a fall while trying to get [place] a [place] [employer] parking lot.      Evaluation in the emergency department revealed a leukocytosis with low-grade fever, tachycardia tachypnea.  Her creatinine level was elevated at 2.9.  Chest x-ray showed bibasilar opacities concerning for pneumonia.  X-ray of the left knee was unremarkable.  Patient was then admitted to hospital for community-acquired pneumonia and acute kidney injury.    Patient did have urinary retention and required Foley catheter.  Nephrology was consulted during hospitalization.  Patient still has significantly elevated creatinine and acute kidney injury but is trending down..  Assessment/Plan:  Principal Problem:   CAP (community acquired pneumonia) Active Problems:   AKI (acute kidney injury) (HCC)   Diabetes (HCC)   Essential hypertension   Hyperlipemia   Cellulitis and abscess of buttock  Severe sepsis due to community-acquired pneumonia -completed 5-day course of antibiotic with Rocephin and Zithromax.  Currently on room air.  Improved.  Mild hypokalemia.  Improved with replacement.  Latest potassium of 4.1.  Acute urinary retention -with acute kidney injury, continue Foley catheter for now.  We will try voiding trial when renal function continues to improve and if it is okay with nephrology.  Acute kidney injury with proteinuria, with metabolic acidosis.  Nephrology on board. Continue to hold Lasix and losartan.  Currently on sodium bicarb drip.   Continue strict intake and output charting.  Follow nephrology recommendation.  Lab Results  Component Value Date   CREATININE 3.58  (H) 11/14/2020   CREATININE 4.03 (H) 11/13/2020   CREATININE 4.42 (H) 11/12/2020    Acute metabolic encephalopathy -resolved.  Likely multifactorial from sedatives, infection and renal failure.  Improved at this time.  Arterial blood gas without hypercarbia.  CT head scan was negative.  Ammonia levels were normal as well.  MRI showed chronic microvascular changes.  Was given antibiotic for pneumonia.  Elevated LFTs.-  Right upper quadrant ultrasound showed increased echogenicity.  Slightly irregular.  Cirrhosis could not be excluded.  Will need outpatient follow-up for LFTs.   Likely OSA  -will benefit from outpatient sleep study.  ABG without hypercarbia.  Type 2 diabetes with neuropathy -patient is on Levemir 35 units BID, Novolog 20-25 units TID WC and Trulicity at home.  On decreased dose of long-acting and sliding scale insulin at this time due to episodes of hypoglycemia.  Currently on Levemir 15 units twice a day with NovoLog 2 units 3 times daily with meals and sliding scale insulin.  Essential hypertension   Coreg, clonidine, Lasix and losartan at home.  Appears to be accelerated.  Continue to hold losartan and Lasix due to AKI.  Continue Coreg and clonidine.  Will change on as needed hydralazine to p.o. 25 3 times daily for now.  Hx of CVA   continue aspirin and Plavix.  MRI of the brain was negative for acute findings.  No focal findings  Depression / Anxiety - continue Elavil but at a decreased dose.  Currently on 10 mg.  Was on 50 mg at home.  Patient would probably benefit from low-dose on discharge.  Hypothyroidism - continue levothyroxine  Gout -hold allopurinol due to acute kidney injury  Morbid obesity: Body mass index is 40.1 kg/m.    Would benefit from lifestyle modification to lose weight as outpatient.  DVT prophylaxis: Heparin subcu  Gluteal abscess seen by general surgery.  Status post I&D on 11/13/2020. has been started on IV Unasyn.  Wound culture  showing some rare gram-positive cocci.  Patient was on Rocephin prior to I&D.  Code Status: Full code  Family Communication:  Spoke with the patient's daughter and updated her about the clinical condition of the patient and potential disposition soon.   Status is: Inpatient  Remains inpatient appropriate because:IV treatments appropriate due to intensity of illness or inability to take PO, Inpatient level of care appropriate due to severity of illness and Likely need for skilled nursing facility placement, IV antibiotics, acute kidney injury, gluteal abscess needing I&D   Dispo: The patient is from: Home              Anticipated d/c is to: Home with home health PT on discharge              Anticipated d/c date is: 2 to 3 days, follow nephrology recommendations.              Patient currently is not medically stable to d/c.  Consultants:  Nephrology  Neurology  General surgery   Procedures:  Foley catheter placement  Incision and drainage of the gluteal abscess on 11/06/2019  Antibiotics:  . Rocephin and Zithromax 11/20>11/25 . Unasyn >11/12/2020  Subjective:  Today, patient was seen and examined at bedside.  Denies any pain, nausea, vomiting, discomfort.  Awaiting oral diet.  Has had a bowel movement yesterday.  Objective: Vitals:   11/14/20 0010 11/14/20 0423  BP: (!) 178/78 (!) 196/92  Pulse: 73 67  Resp: 18 18  Temp: 98.6 F (37 C) 98.1 F (36.7 C)  SpO2: 92% 91%    Intake/Output Summary (Last 24 hours) at 11/14/2020 0962 Last data filed at 11/14/2020 0400 Gross per 24 hour  Intake 1044.8 ml  Output 2950 ml  Net -1905.2 ml   Filed Weights   11/05/20 1423  Weight: 109.3 kg   Body mass index is 40.1 kg/m.   Physical Exam: General: alert, awake and not in obvious distress, morbidly obese HENT:   No scleral pallor or icterus noted. Oral mucosa is moist.  Chest:  Clear breath sounds.  Diminished breath sounds bilaterally. No crackles or wheezes.    CVS: S1 &S2 heard. No murmur.  Regular rate and rhythm. Abdomen: Soft, nontender, nondistended.  Bowel sounds are heard.  Foley catheter in place.  Abscess in the buttocks status post I&D extremities: No cyanosis, clubbing or edema.  Peripheral pulses are palpable. Psych: Alert, awake and oriented, normal mood CNS:  No cranial nerve deficits.  Power equal in all extremities.   Skin: Warm and dry.  Gluteal abscess post I&D  Data Review: I have personally reviewed the following laboratory data and studies,  CBC: Recent Labs  Lab 11/07/20 0933 11/08/20 0554 11/10/20 0537 11/11/20 0542 11/12/20 0507 11/13/20 0521 11/14/20 0437  WBC 12.2*   < > 12.0* 10.7* 10.7* 11.7* 13.9*  NEUTROABS 7.9*  --   --   --   --   --   --   HGB 9.5*   < > 9.6* 9.2* 9.6* 9.6* 9.5*  HCT 29.8*   < > 28.7* 28.5* 29.5* 29.6* 29.4*  MCV 81.9   < > 78.8* 79.8* 79.7* 79.1* 80.8  PLT 183   < >  246 272 301 307 311   < > = values in this interval not displayed.   Basic Metabolic Panel: Recent Labs  Lab 11/09/20 0545 11/09/20 0545 11/10/20 0537 11/11/20 0542 11/12/20 0507 11/13/20 0521 11/14/20 0437  NA 137   < > 136 138 138 138 134*  K 3.8   < > 3.7 3.7 3.4* 3.8 4.1  CL 106   < > 103 105 106 104 99  CO2 19*   < > 20* 21* GLUCOSE 57*   < > 58* 210* 64* 167* 355*  BUN 52*   < > 49* 47* 43* 36* 37*  CREATININE 4.91*   < > 5.05* 4.81* 4.42* 4.03* 3.58*  CALCIUM 8.8*   < > 9.1 8.8* 8.9 8.9 8.9  MG  --   --  2.4 2.2  --  2.4 2.3  PHOS 6.1*  --   --   --   --   --   --    < > = values in this interval not displayed.   Liver Function Tests: Recent Labs  Lab 11/07/20 0933 11/08/20 0554 11/08/20 1822 11/09/20 0545 11/11/20 0542  AST 107* 93* 126*  --  103*  ALT 62* 55* 64*  --  65*  ALKPHOS 193* 205* 260*  --  372*  BILITOT 1.3* 1.4* 1.9*  --  1.2  PROT 6.8 7.0 7.2  --  7.3  ALBUMIN 2.0* 2.1* 1.9* 1.9* 1.8*   No results for input(s): LIPASE, AMYLASE in the last 168 hours. Recent Labs   Lab 11/07/20 1425  AMMONIA 27   Cardiac Enzymes: No results for input(s): CKTOTAL, CKMB, CKMBINDEX, TROPONINI in the last 168 hours. BNP (last 3 results) No results for input(s): BNP in the last 8760 hours.  ProBNP (last 3 results) No results for input(s): PROBNP in the last 8760 hours.  CBG: Recent Labs  Lab 11/13/20 0831 11/13/20 1237 11/13/20 1512 11/13/20 2006 11/14/20 0039  GLUCAP 139* 97 125* 409* 366*   Recent Results (from the past 240 hour(s))  Resp Panel by RT-PCR (Flu A&B, Covid) Nasopharyngeal Swab     Status: None   Collection Time: 11/05/20  9:36 PM   Specimen: Nasopharyngeal Swab; Nasopharyngeal(NP) swabs in vial transport medium  Result Value Ref Range Status   SARS Coronavirus 2 by RT PCR NEGATIVE NEGATIVE Final    Comment: (NOTE) SARS-CoV-2 target nucleic acids are NOT DETECTED.  The SARS-CoV-2 RNA is generally detectable in upper respiratory specimens during the acute phase of infection. The lowest concentration of SARS-CoV-2 viral copies this assay can detect is 138 copies/mL. A negative result does not preclude SARS-Cov-2 infection and should not be used as the sole basis for treatment or other patient management decisions. A negative result may occur with  improper specimen collection/handling, submission of specimen other than nasopharyngeal swab, presence of viral mutation(s) within the areas targeted by this assay, and inadequate number of viral copies(<138 copies/mL). A negative result must be combined with clinical observations, patient history, and epidemiological information. The expected result is Negative.  Fact Sheet for Patients:  BloggerCourse.com  Fact Sheet for Healthcare Providers:  SeriousBroker.it  This test is no t yet approved or cleared by the [place] FDA and  has been authorized for detection and/or diagnosis of SARS-CoV-2 by FDA under an Emergency Use Authorization  (EUA). This EUA will remain  in effect (meaning this test can be used) for the duration of the COVID-19 declaration under Section 564(b)(1)  of the Act, 21 U.S.C.section 360bbb-3(b)(1), unless the authorization is terminated  or revoked sooner.       Influenza A by PCR NEGATIVE NEGATIVE Final   Influenza B by PCR NEGATIVE NEGATIVE Final    Comment: (NOTE) The Xpert Xpress SARS-CoV-2/FLU/RSV plus assay is intended as an aid in the diagnosis of influenza from Nasopharyngeal swab specimens and should not be used as a sole basis for treatment. Nasal washings and aspirates are unacceptable for Xpert Xpress SARS-CoV-2/FLU/RSV testing.  Fact Sheet for Patients: BloggerCourse.com  Fact Sheet for Healthcare Providers: SeriousBroker.it  This test is not yet approved or cleared by the [place] FDA and has been authorized for detection and/or diagnosis of SARS-CoV-2 by FDA under an Emergency Use Authorization (EUA). This EUA will remain in effect (meaning this test can be used) for the duration of the COVID-19 declaration under Section 564(b)(1) of the Act, 21 U.S.C. section 360bbb-3(b)(1), unless the authorization is terminated or revoked.  Performed at [facility], [address] [place], [place] [address]   Culture, blood (routine x 2) Call MD if unable to obtain prior to antibiotics being given     Status: None   Collection Time: 11/06/20 12:46 AM   Specimen: BLOOD  Result Value Ref Range Status   Specimen Description BLOOD LEFT ANTECUBITAL  Final   Special Requests   Final    BOTTLES DRAWN AEROBIC AND ANAEROBIC Blood Culture adequate volume   Culture   Final    NO GROWTH 5 DAYS Performed at [facility], [address], [place], [place] [address]    Report Status 11/11/2020 FINAL  Final  Culture, blood (routine x 2) Call MD if unable to obtain prior to antibiotics being given     Status: None    Collection Time: 11/06/20 12:46 AM   Specimen: BLOOD  Result Value Ref Range Status   Specimen Description BLOOD BLOOD LEFT FOREARM  Final   Special Requests   Final    BOTTLES DRAWN AEROBIC AND ANAEROBIC Blood Culture adequate volume   Culture   Final    NO GROWTH 5 DAYS Performed at [facility], [address] [place], [place] [address]    Report Status 11/11/2020 FINAL  Final  Urine Culture     Status: None   Collection Time: 11/08/20  9:11 AM   Specimen: Urine, Random  Result Value Ref Range Status   Specimen Description   Final    URINE, RANDOM Performed at [facility], [address] [place], [place] [address]    Special Requests   Final    NONE Performed at [facility], [address] [place], [place] [address]    Culture   Final    NO GROWTH Performed at [facility], [address] [place], [place] [address]    Report Status 11/09/2020 FINAL  Final  Aerobic/Anaerobic Culture (surgical/deep wound)     Status: None (Preliminary result)   Collection Time: 11/13/20 12:01 PM   Specimen: PATH Soft tissue  Result Value Ref Range Status   Specimen Description   Final    TISSUE Performed at [facility], [address], [place], [place] [address]    Special Requests   Final    RIGHT BUTTOCK Performed at [facility], [address] [place], [place] [address]    Gram Stain   Final    ABUNDANT WBC PRESENT, PREDOMINANTLY PMN RARE GRAM POSITIVE COCCI Performed at [facility], [address], [place], [place] [address]    Culture PENDING  Incomplete   Report Status PENDING  Incomplete  Aerobic/Anaerobic Culture (surgical/deep wound)     Status: None (Preliminary result)   Collection Time: 11/13/20 12:01 PM   Specimen: PATH Cytology Misc. fluid; Body Fluid  Result Value Ref Range Status   Specimen Description   Final    WOUND Performed at [facility], [address] [place], [place]  [address]    Special Requests   Final    RIGHT BUTTOCKS Performed at [facility], [address] [place], [place] [address]    Gram Stain   Final    RARE WBC PRESENT, PREDOMINANTLY PMN NO ORGANISMS SEEN Performed at [facility], [address], [place], [place] [address]    Culture PENDING  Incomplete   Report Status PENDING  Incomplete     Studies: No results found.    Joycelyn Das, MD  [facility] 11/14/2020

## 2020-11-14 NOTE — Progress Notes (Signed)
Physical Therapy Treatment Patient Details Name: Tina Gregory MRN: [id number] DOB: 1961-05-11 Today's Date: 11/14/2020    History of Present Illness Pt is 59 y/o F with PMH: DM, HTN, HLD, diabetic neuropathy, and CVA. Pt brought to [facility] ED on 11/05/20 by family secondary to generalized weakness and a fall while trying to get in a wheelchair in [place] parking lot. Pt admitted d/t community-acquired pneumonia and AKI.    PT Comments    Ready for session.  To EOB with increased time but no physical assist.  Steady in sitting.  She is able to stand and progress gait to door and back in room with RW and min guard/supervision.  Gait slow but steady with no buckling or LOB.  She does report some dizziness 8/10 with gait and is instructed to return to chair.  Eases with sitting.  She stated she gets dizzy occasionally at home.  Stated she has talked with MD in the past about it and "it just happens sometimes" and attributes it to immobility.  Her BP is running high today which may be attributing it to also.  Education provided regarding awareness and safety at home.  She remains firm in her decision to return home vs SNF at discharge.  Given improved mobility and good support at home will update discharge recommendations.   Follow Up Recommendations  Home health PT;Supervision for mobility/OOB     Equipment Recommendations  Rolling walker with 5" wheels    Recommendations for Other Services       Precautions / Restrictions Precautions Precautions: Fall Restrictions Other Position/Activity Restrictions: dizziness    Mobility  Bed Mobility Overal bed mobility: Modified Independent Bed Mobility: Supine to Sit     Supine to sit: Modified independent (Device/Increase time)        Transfers Overall transfer level: Needs assistance Equipment used: Rolling walker (2 wheeled) Transfers: Sit to/from Stand Sit to Stand: Modified independent (Device/Increase time)             Ambulation/Gait Ambulation/Gait assistance: Supervision;Min guard Gait Distance (Feet): 35 Feet Assistive device: Rolling walker (2 wheeled) Gait Pattern/deviations: Step-through pattern;Decreased step length - right;Decreased step length - left Gait velocity: decreased   General Gait Details: to door and back, limited by dizziness but no LOB or buckling   Stairs             Wheelchair Mobility    Modified Rankin (Stroke Patients Only)       Balance Overall balance assessment: Needs assistance Sitting-balance support: Feet supported Sitting balance-Leahy Scale: Good     Standing balance support: No upper extremity supported Standing balance-Leahy Scale: Fair                              Cognition Arousal/Alertness: Awake/alert Behavior During Therapy: WFL for tasks assessed/performed Overall Cognitive Status: Within Functional Limits for tasks assessed                                        Exercises      General Comments        Pertinent Vitals/Pain Pain Assessment: Faces Faces Pain Scale: Hurts a little bit Pain Location: bottom Pain Descriptors / Indicators: Tender Pain Intervention(s): Limited activity within patient's tolerance;Repositioned    Home Living  Prior Function            PT Goals (current goals can now be found in the care plan section) Progress towards PT goals: Progressing toward goals    Frequency    Min 2X/week      PT Plan Discharge plan needs to be updated    Co-evaluation              AM-PAC PT "6 Clicks" Mobility   Outcome Measure  Help needed turning from your back to your side while in a flat bed without using bedrails?: None Help needed moving from lying on your back to sitting on the side of a flat bed without using bedrails?: None Help needed moving to and from a bed to a chair (including a wheelchair)?: A Little Help needed standing up  from a chair using your arms (e.g., wheelchair or bedside chair)?: A Little Help needed to walk in hospital room?: A Little Help needed climbing 3-5 steps with a railing? : A Lot 6 Click Score: 19    End of Session Equipment Utilized During Treatment: Gait belt Activity Tolerance: Patient tolerated treatment well Patient left: in chair;with call bell/phone within reach;with chair alarm set Nurse Communication: Mobility status       Time: [id number] PT Time Calculation (min) (ACUTE ONLY): 19 min  Charges:  $Gait Training: 8-22 mins                    Danielle Dess, PTA 11/14/20, 12:55 PM

## 2020-11-14 NOTE — Progress Notes (Signed)
[facility] SURGICAL PROGRESS NOTE  Hospital Day(s): 9.   Post op day(s): 1 Day Post-Op.   Interval History:  Patient seen and examined no acute events or new complaints overnight.  Patient reports she is doing well, some soreness at I&D site No fever, chills Slight bump in leukocytosis to 13.9k this morning; may be reactive from surgery; no fevers Renal function improving, still markedly elevated sCr at 3.58, UO - 2.9L Cx from OR growing gram + cocci in pairs She remains on Unasyn  Vital signs in last 24 hours: [min-max] current  Temp:  [97.1 F (36.2 C)-98.6 F (37 C)] 98.1 F (36.7 C) (11/29 0423) Pulse Rate:  [63-76] 67 (11/29 0423) Resp:  [16-20] 18 (11/29 0423) BP: (157-196)/(78-95) 196/92 (11/29 0423) SpO2:  [91 %-100 %] 91 % (11/29 0423)     Height: 5\' 5"  (165.1 cm) Weight: 109.3 kg BMI (Calculated): 40.1   Intake/Output last 2 shifts:  11/28 0701 - 11/29 0700 In: 1044.8 [I.V.:800.3; IV Piggyback:244.5] Out: 2950 [Urine:2950]   Physical Exam:  Constitutional: alert, cooperative and no distress  Respiratory: breathing non-labored at rest  Cardiovascular: regular rate and sinus rhythm  Integumentary: 7x4 cm debridement to the right buttock about 2 cm to the anal verge, there is some punctate bleeding at skin edges, no erythema or purulence appreciable.   Labs:  CBC Latest Ref Rng & Units 11/14/2020 11/13/2020 11/12/2020  WBC 4.0 - 10.5 K/uL 13.9(H) 11.7(H) 10.7(H)  Hemoglobin 12.0 - 15.0 g/dL 11/14/2020) 0.6(C) 3.7(S)  Hematocrit 36 - 46 % 29.4(L) 29.6(L) 29.5(L)  Platelets 150 - 400 K/uL 311 307 301   CMP Latest Ref Rng & Units 11/14/2020 11/13/2020 11/12/2020  Glucose 70 - 99 mg/dL 11/14/2020) 151(V) 616(W)  BUN 6 - 20 mg/dL 73(X) 10(G) 26(R)  Creatinine 0.44 - 1.00 mg/dL 48(N) 4.62(V) 0.35(K)  Sodium 135 - 145 mmol/L 134(L) 138 138  Potassium 3.5 - 5.1 mmol/L 4.1 3.8 3.4(L)  Chloride 98 - 111 mmol/L 99 104 106  CO2 22 - 32 mmol/L 22 24 22   Calcium  8.9 - 10.3 mg/dL 8.9 8.9 8.9  Total Protein 6.5 - 8.1 g/dL - - -  Total Bilirubin 0.3 - 1.2 mg/dL - - -  Alkaline Phos 38 - 126 U/L - - -  AST 15 - 41 U/L - - -  ALT 0 - 44 U/L - - -     Imaging studies: No new pertinent imaging studies   Assessment/Plan: 59 y.o. female 1 Day Post-Op s/p incision and drainage of right buttocks abscess with debridement of skin and subcutaneous tissue for an area of 7 x 4 cm   - Continue IV Abx (Unasyn); follow up Cx from OR; narrow appropriately  - Continue BID dressing changes with dry gauze, secure with tape. Unfortunately wound will not be amenable to wound vac   - Pain control prn   - Monitor leukocytosis   - Further management per primary service; we will follow   All of the above findings and recommendations were discussed with the patient, and the medical team, and all of patient's questions were answered to her expressed satisfaction.  -- , PA-C [facility] 11/14/2020, 9:45 AM [phone] M-F: 7am - 4pm

## 2020-11-14 NOTE — Progress Notes (Addendum)
[facility]  ROUNDING NOTE   Subjective:   [name] is 59 years old African-American female with history of diabetes mellitus, hypertension, diabetic neuropathy, CVA, hyperlipidemia who was admitted on 11/05/2020 with community-acquired pneumonia and acute kidney injury.  Patient appears in no acute distress today, sitting up, consuming her breakfast.  She reports feeling much better.  We will start her home blood pressure medications as her blood pressure stays high.  Objective:  Vital signs in last 24 hours:  Temp:  [97.1 F (36.2 C)-98.6 F (37 C)] 98.1 F (36.7 C) (11/29 0423) Pulse Rate:  [63-76] 67 (11/29 0423) Resp:  [16-20] 18 (11/29 0423) BP: (157-196)/(78-95) 196/92 (11/29 0423) SpO2:  [91 %-100 %] 91 % (11/29 0423)  Weight change:  Filed Weights   11/05/20 1423  Weight: 109.3 kg    Intake/Output: I/O last 3 completed shifts: In: 1552.5 [I.V.:1252.5; IV Piggyback:300] Out: 5650 [Urine:5650]   Intake/Output this shift:  No intake/output data recorded.  Physical Exam: General: Sitting up in bed, appears pleasant and comfortable  Head:  Oral mucous membranes moist  Eyes:  Anicteric  Lungs:  Lungs clear ,respirations even,unlabored  Heart:  Regular rate and rhythm  Abdomen:  Soft, nontender, obese  Extremities: No peripheral edema.  Neurologic: Alert,awake, speech clear and appropriate  Skin:  rashes on her lower legs    Basic Metabolic Panel: Recent Labs  Lab 11/09/20 0545 11/09/20 0545 11/10/20 0537 11/10/20 0537 11/11/20 0542 11/11/20 0542 11/12/20 0507 11/13/20 0521 11/14/20 0437  NA 137   < > 136  --  138  --  138 138 134*  K 3.8   < > 3.7  --  3.7  --  3.4* 3.8 4.1  CL 106   < > 103  --  105  --  106 104 99  CO2 19*   < > 20*  --  21*  --  22 24 22   GLUCOSE 57*   < > 58*  --  210*  --  64* 167* 355*  BUN 52*   < > 49*  --  47*  --  43* 36* 37*  CREATININE 4.91*   < > 5.05*  --  4.81*  --  4.42* 4.03* 3.58*  CALCIUM 8.8*   < >  9.1   < > 8.8*   < > 8.9 8.9 8.9  MG  --   --  2.4  --  2.2  --   --  2.4 2.3  PHOS 6.1*  --   --   --   --   --   --   --   --    < > = values in this interval not displayed.    Liver Function Tests: Recent Labs  Lab 11/08/20 0554 11/08/20 1822 11/09/20 0545 11/11/20 0542  AST 93* 126*  --  103*  ALT 55* 64*  --  65*  ALKPHOS 205* 260*  --  372*  BILITOT 1.4* 1.9*  --  1.2  PROT 7.0 7.2  --  7.3  ALBUMIN 2.1* 1.9* 1.9* 1.8*   No results for input(s): LIPASE, AMYLASE in the last 168 hours. Recent Labs  Lab 11/07/20 1425  AMMONIA 27    CBC: Recent Labs  Lab 11/10/20 0537 11/11/20 0542 11/12/20 0507 11/13/20 0521 11/14/20 0437  WBC 12.0* 10.7* 10.7* 11.7* 13.9*  HGB 9.6* 9.2* 9.6* 9.6* 9.5*  HCT 28.7* 28.5* 29.5* 29.6* 29.4*  MCV 78.8* 79.8* 79.7* 79.1* 80.8  PLT 246  272 301 307 311    Cardiac Enzymes: No results for input(s): CKTOTAL, CKMB, CKMBINDEX, TROPONINI in the last 168 hours.  BNP: Invalid input(s): POCBNP  CBG: Recent Labs  Lab 11/13/20 1237 11/13/20 1512 11/13/20 2006 11/14/20 0039 11/14/20 0843  GLUCAP 97 125* 409* 366* 315*    Microbiology: Results for orders placed or performed during the hospital encounter of 11/05/20  Resp Panel by RT-PCR (Flu A&B, Covid) Nasopharyngeal Swab     Status: None   Collection Time: 11/05/20  9:36 PM   Specimen: Nasopharyngeal Swab; Nasopharyngeal(NP) swabs in vial transport medium  Result Value Ref Range Status   SARS Coronavirus 2 by RT PCR NEGATIVE NEGATIVE Final    Comment: (NOTE) SARS-CoV-2 target nucleic acids are NOT DETECTED.  The SARS-CoV-2 RNA is generally detectable in upper respiratory specimens during the acute phase of infection. The lowest concentration of SARS-CoV-2 viral copies this assay can detect is 138 copies/mL. A negative result does not preclude SARS-Cov-2 infection and should not be used as the sole basis for treatment or other patient management decisions. A negative result  may occur with  improper specimen collection/handling, submission of specimen other than nasopharyngeal swab, presence of viral mutation(s) within the areas targeted by this assay, and inadequate number of viral copies(<138 copies/mL). A negative result must be combined with clinical observations, patient history, and epidemiological information. The expected result is Negative.  Fact Sheet for Patients:  BloggerCourse.comhttps://www.fda.gov/media/152166/download  Fact Sheet for Healthcare Providers:  SeriousBroker.ithttps://www.fda.gov/media/152162/download  This test is no t yet approved or cleared by the [place] FDA and  has been authorized for detection and/or diagnosis of SARS-CoV-2 by FDA under an Emergency Use Authorization (EUA). This EUA will remain  in effect (meaning this test can be used) for the duration of the COVID-19 declaration under Section 564(b)(1) of the Act, 21 U.S.C.section 360bbb-3(b)(1), unless the authorization is terminated  or revoked sooner.       Influenza A by PCR NEGATIVE NEGATIVE Final   Influenza B by PCR NEGATIVE NEGATIVE Final    Comment: (NOTE) The Xpert Xpress SARS-CoV-2/FLU/RSV plus assay is intended as an aid in the diagnosis of influenza from Nasopharyngeal swab specimens and should not be used as a sole basis for treatment. Nasal washings and aspirates are unacceptable for Xpert Xpress SARS-CoV-2/FLU/RSV testing.  Fact Sheet for Patients: BloggerCourse.comhttps://www.fda.gov/media/152166/download  Fact Sheet for Healthcare Providers: SeriousBroker.ithttps://www.fda.gov/media/152162/download  This test is not yet approved or cleared by the [place] FDA and has been authorized for detection and/or diagnosis of SARS-CoV-2 by FDA under an Emergency Use Authorization (EUA). This EUA will remain in effect (meaning this test can be used) for the duration of the COVID-19 declaration under Section 564(b)(1) of the Act, 21 U.S.C. section 360bbb-3(b)(1), unless the authorization is terminated  or revoked.  Performed at [facility], [address] [place], [place] [address]   Culture, blood (routine x 2) Call MD if unable to obtain prior to antibiotics being given     Status: None   Collection Time: 11/06/20 12:46 AM   Specimen: BLOOD  Result Value Ref Range Status   Specimen Description BLOOD LEFT ANTECUBITAL  Final   Special Requests   Final    BOTTLES DRAWN AEROBIC AND ANAEROBIC Blood Culture adequate volume   Culture   Final    NO GROWTH 5 DAYS Performed at [facility], [address] [place], [place] [address]    Report Status 11/11/2020 FINAL  Final  Culture, blood (routine x 2)  Call MD if unable to obtain prior to antibiotics being given     Status: None   Collection Time: 11/06/20 12:46 AM   Specimen: BLOOD  Result Value Ref Range Status   Specimen Description BLOOD BLOOD LEFT FOREARM  Final   Special Requests   Final    BOTTLES DRAWN AEROBIC AND ANAEROBIC Blood Culture adequate volume   Culture   Final    NO GROWTH 5 DAYS Performed at [facility], [address], [place], [place] [address]    Report Status 11/11/2020 FINAL  Final  Urine Culture     Status: None   Collection Time: 11/08/20  9:11 AM   Specimen: Urine, Random  Result Value Ref Range Status   Specimen Description   Final    URINE, RANDOM Performed at [facility], [address], [place], [place] [address]    Special Requests   Final    NONE Performed at [facility], [address], [place], [place] [address]    Culture   Final    NO GROWTH Performed at [facility], [address], [place], [place] [address]    Report Status 11/09/2020 FINAL  Final  Aerobic/Anaerobic Culture (surgical/deep wound)     Status: None (Preliminary result)   Collection Time: 11/13/20 12:01 PM   Specimen: PATH Soft tissue  Result Value Ref Range Status   Specimen Description   Final    TISSUE Performed at [facility], [address] [place], [place] [address]    Special Requests   Final    RIGHT BUTTOCK Performed at [facility], [address] [place], [place] [address]    Gram Stain   Final    ABUNDANT WBC PRESENT, PREDOMINANTLY PMN RARE GRAM POSITIVE COCCI    Culture   Final    CULTURE REINCUBATED FOR BETTER GROWTH Performed at [facility], [address], [place], [place] [address]    Report Status PENDING  Incomplete  Aerobic/Anaerobic Culture (surgical/deep wound)     Status: None (Preliminary result)   Collection Time: 11/13/20 12:01 PM   Specimen: PATH Cytology Misc. fluid; Body Fluid  Result Value Ref Range Status   Specimen Description   Final    WOUND Performed at [facility], [address] [place], [place] [address]    Special Requests   Final    RIGHT BUTTOCKS Performed at [facility], [address] [place], [place] [address]    Gram Stain   Final    RARE WBC PRESENT, PREDOMINANTLY PMN NO ORGANISMS SEEN    Culture   Final    CULTURE REINCUBATED FOR BETTER GROWTH Performed at [facility], [address], [place], [place] [address]    Report Status PENDING  Incomplete    Coagulation Studies: No results for input(s): LABPROT, INR in the last 72 hours.  Urinalysis: No results for input(s): COLORURINE, LABSPEC, PHURINE, GLUCOSEU, HGBUR, BILIRUBINUR, KETONESUR, PROTEINUR, UROBILINOGEN, NITRITE, LEUKOCYTESUR in the last 72 hours.  Invalid input(s): APPERANCEUR    Imaging: No results found.   Medications:    ampicillin-sulbactam (UNASYN) IV Stopped (11/14/20 0319)   sodium bicarbonate in D5W 1000 mL infusion 50 mL/hr at 11/14/20 0400    amitriptyline  10 mg Oral QHS   aspirin EC  81 mg Oral Daily   carvedilol  25 mg Oral BID   Chlorhexidine Gluconate Cloth  6 each Topical Daily   cloNIDine  0.2 mg Oral  BID   clopidogrel  75 mg Oral Daily   feeding supplement  237 mL Oral TID with meals   heparin injection  (subcutaneous)  5,000 Units Subcutaneous Q8H   hydrALAZINE  25 mg Oral Q8H   hydrocerin   Topical BID   hydrocortisone cream   Topical BID   insulin aspart  0-15 Units Subcutaneous TID WC   insulin aspart  0-5 Units Subcutaneous QHS   insulin aspart  2 Units Subcutaneous TID WC   insulin detemir  15 Units Subcutaneous BID   levothyroxine  25 mcg Oral Daily   loratadine  10 mg Oral Daily   losartan  25 mg Oral Daily   multivitamin with minerals  1 tablet Oral Daily   nicotine  21 mg Transdermal Daily   saccharomyces boulardii  250 mg Oral BID   acetaminophen, dextrose, guaiFENesin-dextromethorphan, HYDROmorphone (DILAUDID) injection, loperamide, oxyCODONE  Assessment/ Plan:  [name] is a 59 y.o.  female with history of diabetes mellitus, hypertension, diabetic neuropathy, CVA, hyperlipidemia who was admitted on 11/05/2020 with community-acquired pneumonia and acute kidney injury.  # Acute kidney injury with proteinuria Baseline creatinine unknown. Creatinine on admission of 2.96, GFR of 18.  Lab Results  Component Value Date   CREATININE 3.58 (H) 11/14/2020   CREATININE 4.03 (H) 11/13/2020   CREATININE 4.42 (H) 11/12/2020  Renal function is improving progressively Urine output for the preceding 24 hours 2,950 ml Will continue monitoring No acute indication for dialysis  #Metabolic Acidosis  Improved to 22 Will  discontinue Sodium bicarbonate infusion   #Diabetes type II with CKD Lab Results  Component Value Date   HGBA1C 10.8 (H) 11/06/2020  Blood glucose readings above goal, 355 this morning Patient is on Insulin Aspart or Insulin Detemir   #Hypertension Blood pressure 196/92 Patient reports angioedema  to Lisinopril when it started previously Patient was prescribed on Losartan as outpatient recently, but never got a chance to fill the prescription from pharmacy due to admission to hospital.  Plan to start her on Losartan today, patient warned to  be watchful about allergic symptoms and alert RN  May also need to add ca channel blocker    S 9 Princy Raju 11/29/202111:32 AM  I saw and evaluated the patient and discussed the care with Denna Haggard, DNP.  I agree with the findings and plan as documented in the note.    Mosetta Pigeon , MD [facility] 11/29/20214:06 PM

## 2020-11-14 NOTE — Progress Notes (Signed)
Pt c/o itching all over stating that her "allergies are acting up."  Claritin ordered per protocol.

## 2020-11-15 DIAGNOSIS — L0231 Cutaneous abscess of buttock: Secondary | ICD-10-CM

## 2020-11-15 DIAGNOSIS — L03317 Cellulitis of buttock: Secondary | ICD-10-CM

## 2020-11-15 LAB — GLUCOSE, CAPILLARY
Glucose-Capillary: 275 mg/dL — ABNORMAL HIGH (ref 70–99)
Glucose-Capillary: 258 mg/dL — ABNORMAL HIGH (ref 70–99)

## 2020-11-15 LAB — BASIC METABOLIC PANEL
Anion gap: 13 (ref 5–15)
BUN: 37 mg/dL — ABNORMAL HIGH (ref 6–20)
CO2: 22 mmol/L (ref 22–32)
Calcium: 8.8 mg/dL — ABNORMAL LOW (ref 8.9–10.3)
Chloride: 98 mmol/L (ref 98–111)
Creatinine, Ser: 3.34 mg/dL — ABNORMAL HIGH (ref 0.44–1.00)
GFR, Estimated: 15 mL/min — ABNORMAL LOW (ref >60)
Glucose, Bld: 335 mg/dL — ABNORMAL HIGH (ref 70–99)
Potassium: 3.5 mmol/L (ref 3.5–5.1)
Sodium: 133 mmol/L — ABNORMAL LOW (ref 135–145)

## 2020-11-15 LAB — CBC
HCT: 28.9 % — ABNORMAL LOW (ref 36.0–46.0)
Hemoglobin: 9.2 g/dL — ABNORMAL LOW (ref 12.0–15.0)
MCH: 25.8 pg — ABNORMAL LOW (ref 26.0–34.0)
MCHC: 31.8 g/dL (ref 30.0–36.0)
MCV: 81.2 fL (ref 80.0–100.0)
Platelets: 314 10*3/uL (ref 150–400)
RBC: 3.56 MIL/uL — ABNORMAL LOW (ref 3.87–5.11)
RDW: 19.4 % — ABNORMAL HIGH (ref 11.5–15.5)
WBC: 12.8 10*3/uL — ABNORMAL HIGH (ref 4.0–10.5)
nRBC: 0 % (ref 0.0–0.2)

## 2020-11-15 LAB — MAGNESIUM: Magnesium: 2.2 mg/dL (ref 1.7–2.4)

## 2020-11-15 LAB — VITAMIN B1: Vitamin B1 (Thiamine): 128.7 nmol/L (ref 66.5–200.0)

## 2020-11-15 MED ORDER — LEVEMIR FLEXTOUCH 100 UNIT/ML ~~LOC~~ SOPN: 60.0000 [IU] | PEN_INJECTOR | Freq: Every day | SUBCUTANEOUS | 11 refills | Status: AC

## 2020-11-15 MED ORDER — AMOXICILLIN-POT CLAVULANATE 500-125 MG PO TABS: 500.0000 mg | ORAL_TABLET | Freq: Two times a day (BID) | ORAL | 0 refills | Status: AC

## 2020-11-15 MED ORDER — CLONIDINE HCL 0.1 MG PO TABS
0.2000 mg | ORAL_TABLET | ORAL | Status: AC
  Administered 2020-11-15: 0.2 mg via ORAL
  Filled 2020-11-15: qty 2

## 2020-11-15 MED ORDER — INSULIN ASPART 100 UNIT/ML ~~LOC~~ SOLN
4.0000 [IU] | Freq: Three times a day (TID) | SUBCUTANEOUS | Status: DC
  Administered 2020-11-15: 12:00:00 4 [IU] via SUBCUTANEOUS
  Filled 2020-11-15: qty 1

## 2020-11-15 MED ORDER — INSULIN DETEMIR 100 UNIT/ML ~~LOC~~ SOLN
20.0000 [IU] | Freq: Two times a day (BID) | SUBCUTANEOUS | Status: DC
  Filled 2020-11-15 (×3): qty 0.2

## 2020-11-15 MED ORDER — AMITRIPTYLINE HCL 10 MG PO TABS: 50.0000 mg | ORAL_TABLET | Freq: Every day | ORAL | 2 refills | Status: AC

## 2020-11-15 MED ORDER — PREGABALIN 50 MG PO CAPS: 50.0000 mg | ORAL_CAPSULE | Freq: Three times a day (TID) | ORAL | 1 refills | Status: AC

## 2020-11-15 MED ORDER — NICOTINE 21 MG/24HR TD PT24: 21.0000 mg | MEDICATED_PATCH | Freq: Every day | TRANSDERMAL | 0 refills | Status: AC

## 2020-11-15 MED ORDER — LOSARTAN POTASSIUM 25 MG PO TABS: 25.0000 mg | ORAL_TABLET | Freq: Every day | ORAL | 2 refills | Status: AC

## 2020-11-15 MED ORDER — HYDRALAZINE HCL 25 MG PO TABS: 25.0000 mg | ORAL_TABLET | Freq: Three times a day (TID) | ORAL | 2 refills | Status: AC

## 2020-11-15 NOTE — Progress Notes (Signed)
Inpatient Diabetes Program Recommendations  AACE/ADA: New Consensus Statement on Inpatient Glycemic Control (2015)  Target Ranges:  Prepandial:   less than 140 mg/dL      Peak postprandial:   less than 180 mg/dL (1-2 hours)      Critically ill patients:  140 - 180 mg/dL   Lab Results  Component Value Date   GLUCAP 275 (H) 11/15/2020   HGBA1C 10.8 (H) 11/06/2020    Review of Glycemic Control Results for Tina Gregory, Tina Gregory (MRN [id number]) as of 11/15/2020 08:44  Ref. Range 11/14/2020 08:43 11/14/2020 12:33 11/14/2020 16:14 11/14/2020 21:09 11/15/2020 08:15  Glucose-Capillary Latest Ref Range: 70 - 99 mg/dL 277 (H) 412 (H) 878 (H) 295 (H) 275 (H)   Diabetes history: DM2 Outpatient Diabetes medications: Levemir 80 units qd + Novolog 20 units tid + Trulicity 0.75 qweek Current orders for Inpatient glycemic control: Levemir 15 units bid + Novolog 2 units tid meal coverage + Novolog moderate correction tid + hs 0-5 units  Inpatient Diabetes Program Recommendations:   Noted increase in CBGs -Increase Levemir to 20 units bid -Increase Novolog meal coverage to 4 units tid if eats 50% Secure chat sent to Dr. Tyson Babinski.  Thank you, Billy Fischer. Loella Hickle, RN, MSN, CDE  Diabetes Coordinator Inpatient Glycemic Control Team Team Pager [phone] (8am-5pm) 11/15/2020 8:45 AM

## 2020-11-15 NOTE — TOC Transition Note (Signed)
Transition of Care [facility]) - CM/SW Discharge Note   Patient Details  Name: Shakeerah Gradel MRN: [id number] Date of Birth: July 31, 1961  [facility]) CM/SW Contact:  Allayne Butcher, RN Phone Number: 11/15/2020, 2:01 PM   Clinical Narrative:     Patient is medically cleared for discharge home with home health services.  [facility] is aware of discharge today, orders in for RN, PT, and OT.  Family will be picking patient up.   Final next level of care: Home w Home Health Services Barriers to Discharge: Barriers Resolved   Patient Goals and CMS Choice Patient states their goals for this hospitalization and ongoing recovery are:: "when I walk out of here I want to be well" CMS Medicare.gov Compare Post Acute Care list provided to:: Patient Choice offered to / list presented to : Patient  Discharge Placement                       Discharge Plan and Services   Discharge Planning Services: CM Consult Post Acute Care Choice: Home Health, Resumption of Svcs/PTA Provider            DME Agency: NA       HH Arranged: RN, PT, OT HH Agency: [facility] Date [facility] Agency Contacted: 11/15/20 Time HH Agency Contacted: 1400 Representative spoke with at [facility] Agency: Christy Gentles  Social Determinants of Health (SDOH) Interventions     Readmission Risk Interventions No flowsheet data found.

## 2020-11-15 NOTE — Progress Notes (Addendum)
[facility] SURGICAL PROGRESS NOTE  Hospital Day(s): 10.   Post op day(s): 2 Days Post-Op.   Interval History:  Patient seen and examined no acute events or new complaints overnight.  Patient reports she is doing well, anxious to go home No pain, fever, chills Leukocytosis improving, WBC now 12.8K Renal function improved, sCr - 3.34, UO unmeasured Mild hyponatremia to 133; o/w no electrolyte derangements OR Cx from 11/28 growing enterococcus faecalis; susceptibilities pending  She remains on Unasyn  Vital signs in last 24 hours: [min-max] current  Temp:  [98.4 F (36.9 C)-98.9 F (37.2 C)] 98.9 F (37.2 C) (11/30 0513) Pulse Rate:  [59-71] 64 (11/30 0513) Resp:  [16-18] 16 (11/30 0513) BP: (155-180)/(82-95) 180/95 (11/30 0513) SpO2:  [95 %-100 %] 95 % (11/30 0513)     Height: 5\' 5"  (165.1 cm) Weight: 109.3 kg BMI (Calculated): 40.1   Intake/Output last 2 shifts:  No intake/output data recorded.   Physical Exam:  Constitutional: alert, cooperative and no distress  Respiratory: breathing non-labored at rest  Cardiovascular: regular rate and sinus rhythm  Integumentary: 7x4 cm debridement to the right buttock about 2 cm to the anal verge, there is some punctate bleeding at skin edges, no erythema or purulence appreciable.   Labs:  CBC Latest Ref Rng & Units 11/15/2020 11/14/2020 11/13/2020  WBC 4.0 - 10.5 K/uL 12.8(H) 13.9(H) 11.7(H)  Hemoglobin 12.0 - 15.0 g/dL 11/15/2020) 2.7(N) 1.7(G)  Hematocrit 36 - 46 % 28.9(L) 29.4(L) 29.6(L)  Platelets 150 - 400 K/uL 314 311 307   CMP Latest Ref Rng & Units 11/15/2020 11/14/2020 11/13/2020  Glucose 70 - 99 mg/dL 11/15/2020) 494(W) 967(R)  BUN 6 - 20 mg/dL 916(B) 84(Y) 65(L)  Creatinine 0.44 - 1.00 mg/dL 93(T) 7.01(X) 7.93(J)  Sodium 135 - 145 mmol/L 133(L) 134(L) 138  Potassium 3.5 - 5.1 mmol/L 3.5 4.1 3.8  Chloride 98 - 111 mmol/L 98 99 104  CO2 22 - 32 mmol/L 22 22 24   Calcium 8.9 - 10.3 mg/dL 0.30(S) 8.9 8.9  Total  Protein 6.5 - 8.1 g/dL - - -  Total Bilirubin 0.3 - 1.2 mg/dL - - -  Alkaline Phos 38 - 126 U/L - - -  AST 15 - 41 U/L - - -  ALT 0 - 44 U/L - - -     Imaging studies: No new pertinent imaging studies   Assessment/Plan:  59 y.o. female 2 Days Post-Op s/p incision and drainage of right buttocks abscess with debridement of skin and subcutaneous tissue for an area of 7 x 4 cm   - Continue IV Abx (Unasyn); follow up Cx from OR; narrow appropriately and recommend completely total of 14 days for home (IV + PO)   - Continue BID dressing changes with dry gauze, secure with tape. Unfortunately wound will not be amenable to wound vac. She needs to ensure she will have help with dressing changes at home.    - Pain control prn              - Monitor leukocytosis; improving              - Further management per primary service    - Discharge Planning: Okay for discharge from surgical standpoint once cleared by medicine service; ABx and wound care for home as above; she can follow up with general surgery in 2-3 weeks for wound re-check (or sooner if needed)   All of the above findings and recommendations were discussed with the patient,  and the medical team, and all of patient's questions were answered to her expressed satisfaction.  -- Lynden Oxford, PA-C [facility] 11/15/2020, 7:14 AM [phone] M-F: 7am - 4pm  I saw and evaluated the patient.  I agree with the above documentation, exam, and plan, which I have edited where appropriate. Duanne Guess  1:01 PM

## 2020-11-15 NOTE — Progress Notes (Signed)
Pt DC home, VS WNL, no complaints, daughter will pick her up.

## 2020-11-15 NOTE — Progress Notes (Signed)
Physical Therapy Treatment Patient Details Name: Tina Gregory MRN: [id number] DOB: Jan 19, 1961 Today's Date: 11/15/2020    History of Present Illness Pt is 59 y/o F with PMH: DM, HTN, HLD, diabetic neuropathy, and CVA. Pt brought to [facility] ED on 11/05/20 by family secondary to generalized weakness and a fall while trying to get in a wheelchair in [place] parking lot. Pt admitted d/t community-acquired pneumonia and AKI.    PT Comments    Pt ready for session.  Stood and is able to progress gait 140' in hallway with slow but steady gait.  Occasional self initiated rest breaks and stopping often in hallway to read signs.  She does stop in bathroom to void on return to room and remains in recliner after session.  No dizziness noted today.  Minimal fatigue.   Follow Up Recommendations  Home health PT;Supervision for mobility/OOB     Equipment Recommendations  Rolling walker with 5" wheels    Recommendations for Other Services       Precautions / Restrictions Precautions Precautions: Fall Restrictions Weight Bearing Restrictions: No Other Position/Activity Restrictions: dizziness    Mobility  Bed Mobility Overal bed mobility: Modified Independent       Supine to sit: Modified independent (Device/Increase time)        Transfers Overall transfer level: Needs assistance Equipment used: Rolling walker (2 wheeled) Transfers: Sit to/from Stand Sit to Stand: Modified independent (Device/Increase time)            Ambulation/Gait Ambulation/Gait assistance: Supervision;Min guard Gait Distance (Feet): 140 Feet Assistive device: Rolling walker (2 wheeled)   Gait velocity: decreased   General Gait Details: no dizziness reported today   Stairs             Wheelchair Mobility    Modified Rankin (Stroke Patients Only)       Balance Overall balance assessment: Needs assistance Sitting-balance support: Feet supported Sitting balance-Leahy Scale: Good      Standing balance support: Bilateral upper extremity supported Standing balance-Leahy Scale: Good Standing balance comment: fair w/o UE support, G with support                            Cognition Arousal/Alertness: Awake/alert Behavior During Therapy: WFL for tasks assessed/performed Overall Cognitive Status: Within Functional Limits for tasks assessed                                        Exercises Other Exercises Other Exercises: to bathroom to void    General Comments        Pertinent Vitals/Pain Pain Assessment: No/denies pain    Home Living                      Prior Function            PT Goals (current goals can now be found in the care plan section) Progress towards PT goals: Progressing toward goals    Frequency    Min 2X/week      PT Plan Current plan remains appropriate    Co-evaluation              AM-PAC PT "6 Clicks" Mobility   Outcome Measure  Help needed turning from your back to your side while in a flat bed without using bedrails?: None Help needed moving from lying on your back  to sitting on the side of a flat bed without using bedrails?: None Help needed moving to and from a bed to a chair (including a wheelchair)?: None Help needed standing up from a chair using your arms (e.g., wheelchair or bedside chair)?: None Help needed to walk in hospital room?: A Little Help needed climbing 3-5 steps with a railing? : A Little 6 Click Score: 22    End of Session Equipment Utilized During Treatment: Gait belt Activity Tolerance: Patient tolerated treatment well Patient left: in chair;with call bell/phone within reach;with chair alarm set Nurse Communication: Mobility status       Time: 2081-3887 PT Time Calculation (min) (ACUTE ONLY): 23 min  Charges:  $Gait Training: 23-37 mins                    Danielle Dess, PTA 11/15/20, 9:43 AM

## 2020-11-15 NOTE — Discharge Summary (Addendum)
Physician Discharge Summary  Tina Gregory ZOX:[id number] DOB: [birth year] DOA: 11/05/2020  PCP: Patient, [name]  Admit date: 11/05/2020 Discharge date: 11/15/2020  Admitted From: Home  Discharge disposition: Home with home health   Recommendations for Outpatient Follow-Up:   . Follow up with your primary care provider with in one week.  . Check CBC, BMP, magnesium in the next visit visit . Patient will benefit from sleep study as outpatient . Patient will have to follow-up with nephrology as outpatient for monitoring of her renal function . Antihypertensive medications have been adjusted and Lasix has been kept on hold at this time until seen by primary care physician/nephrology. . Patient did have mildly elevated LFTs.  Will need follow-up as outpatient.   Discharge Diagnosis:   Principal Problem:   CAP (community acquired pneumonia) Active Problems:   AKI (acute kidney injury) (HCC)   Diabetes (HCC)   Essential hypertension   Hyperlipemia   Cellulitis and abscess of buttock    Discharge Condition: Improved.  Diet recommendation: Low sodium, heart healthy.  Carbohydrate-modified.    Wound care: Dressing as advised  Code status: Full.   History of Present Illness:   Tina Rhodeseggy McGeeis a 59 y.o.femalewith medical history significant ofdiabetes, hypertension, diabetic neuropathy, history of CVA, hyperlipidemia was brought in to the ED on 11/05/20 for generalized weakness and a fall while [place] [employer] [place].  Evaluation in the emergency department revealed a leukocytosis with low-grade fever, tachycardia and tachypnea.  Her creatinine level was elevated at 2.9.  Chest x-ray showed bibasilar opacities concerning for pneumonia.  X-ray of the left knee was unremarkable.  Patient was then admitted to hospital for community-acquired pneumonia and acute kidney injury.  Patient did have urinary retention and required Foley catheter.   Nephrology was consulted during hospitalization.   During hospital course, patient had swelling over the buttocks which was noted to be gluteal abscess. General surgery was consulted underwent I&D with significant improvement.  Hospital Course:   Following conditions were addressed during hospitalization as listed below,  Severe sepsis due to community-acquired pneumonia-completed 5-day course of antibiotic with Rocephin and Zithromax.   Improved.  Right gluteal abscess seen by general surgery.  Status post I&D on 11/13/2020.  Spoke with general surgery.  Recommended dressing at home for "a while 10-day course of Augmentin. Reduced dose of Augmentin prescribed at discharge. Will need to follow-up with general surgery as outpatient. . Mild hypokalemia.  Improved with replacement.  Latest potassium of 3.5  Acute urinary retention-with acute kidney injury, received Foley catheter. This has been discontinued at this time and patient has been able to void by herself.  Acute kidney injury with proteinuria, with metabolic acidosis.  Nephrology was consulted. Initially losartan Lasix was on hold. Patient also received sodium bicarb drip during hospitalization. Patient was nonoliguric. At the time of discharge, plan is to change losartan 100mg  to 25 mg daily, add hydralazine 25 mg 3 times daily and hold Lasix until next assessment.  Acute metabolic encephalopathy-resolved.  Likely multifactorial from sedatives, infection and renal failure.  Improved at this time.  Arterial blood gas without hypercarbia.  CT head scan was negative.  Ammonia levels were normal as well.  MRI showed chronic microvascular changes.    Patient was given antibiotic for pneumonia.  On discharge, Elavil dose will be decreased to 5 mg at bedtime, Lyrica will be decreased to 50 mg 3 times daily from 100 mg 3 times daily.  Elevated  LFTs.-  Right upper quadrant ultrasound showed increased echogenicity.  Slightly irregular.   Cirrhosis could not be excluded.  Will need outpatient follow-up for LFTs.   Likely OSA-will benefit from outpatient sleep study.  ABG without hypercarbia.  Type 2 diabetes with neuropathy-dose of insulin had to be adjusted during hospitalization. Will decrease the dose of Levemir to 60 units at bedtime on discharge.   Essential hypertension  Coreg, clonidine, Lasix and losartan at home.   Currently on low-dose losartan at 25 mg Po daily, discontinue clonidine. Continue Coreg at home dose. Lasix on hold until next assessment. Hydralazine 25 mg p.o. 3 times daily has been added.   Hx of CVA   recommend continuation of aspirin and Plavix from home.  MRI of the brain was negative for acute findings.  No focal findings  Depression / Anxiety- continue Elavil but at a decreased dose.  Currently on 10 mg.  Was on 50 mg at home.   Will continue low-dose on discharge.  Hypothyroidism- continue levothyroxine on discharge.  Gout-resume allopurinol.  Morbid obesity:Body mass index is 40.1 kg/m.  Would benefit from lifestyle modification to lose weight as outpatient.   Disposition.  At this time, patient is stable for disposition home with home health, home PT, RN  Medical Consultants:    Nephrology  General surgery  Procedures:     Foley catheter placement  Incision and drainage of the gluteal abscess on 11/13/2020  Subjective:   Today, patient was seen and examined at bedside. Patient denies any nausea, vomiting fever chills or rigor. Wants to go home.  Discharge Exam:   Vitals:   11/15/20 0815 11/15/20 1158  BP: (!) 178/84 (!) 185/82  Pulse: 61 63  Resp: 18 18  Temp: 98.3 F (36.8 C) 98.7 F (37.1 C)  SpO2: 93% 97%   Vitals:   11/15/20 0025 11/15/20 0513 11/15/20 0815 11/15/20 1158  BP: (!) 179/82 (!) 180/95 (!) 178/84 (!) 185/82  Pulse: 71 64 61 63  Resp: 16 16 18 18   Temp: 98.6 F (37 C) 98.9 F (37.2 C) 98.3 F (36.8 C) 98.7 F (37.1 C)   TempSrc: Oral Oral    SpO2: 100% 95% 93% 97%  Weight:      Height:        General: Alert awake, not in obvious distress, morbidly obese HENT: pupils equally reacting to light,  No scleral pallor or icterus noted. Oral mucosa is moist.  Chest:  Clear breath sounds.  Diminished breath sounds bilaterally. No crackles or wheezes.  CVS: S1 &S2 heard. No murmur.  Regular rate and rhythm. Abdomen: Soft, nontender, nondistended.  Bowel sounds are heard. Right buttocks abscess status post I&D. Extremities: No cyanosis, clubbing or edema.  Peripheral pulses are palpable. Psych: Alert, awake and oriented, normal mood CNS:  No cranial nerve deficits.  Power equal in all extremities.   Skin: Warm and dry.   Right buttocks abscess status post I&D.  The results of significant diagnostics from this hospitalization (including imaging, microbiology, ancillary and laboratory) are listed below for reference.     Diagnostic Studies:   MR BRAIN W WO CONTRAST  Result Date: 11/08/2020 CLINICAL DATA:  Mental status changes. EXAM: MRI HEAD WITHOUT AND WITH CONTRAST TECHNIQUE: Multiplanar, multiecho pulse sequences of the brain and surrounding structures were obtained without and with intravenous contrast. CONTRAST:  10mL GADAVIST GADOBUTROL 1 MMOL/ML IV SOLN COMPARISON:  Head CT November 07, 2020. FINDINGS: Brain: No acute infarction, hemorrhage, hydrocephalus, extra-axial collection or mass  lesion. Scattered and confluent foci of T2 hyperintensity are seen within the white matter of the cerebral hemispheres and within the pons, nonspecific, more pronounced than expected for age. No focus of abnormal contrast enhancement. Vascular: Normal flow voids. Skull and upper cervical spine: Normal marrow signal. Sinuses/Orbits: Mild mucosal thickening the bilateral ethmoid cells. The orbits are maintained. Other: Minimal bilateral mastoid effusion. IMPRESSION: 1. No acute intracranial abnormality. 2. Scattered and confluent  foci of T2 hyperintensity within the white matter of the cerebral hemispheres and pons, nonspecific, more pronounced than expected for age. Differential considerations include chronic small vessel ischemic changes, vasculitis, demyelination or other infectious/inflammatory processes. Electronically Signed   By: Baldemar Lenis M.D.   On: 11/08/2020 15:42   US RENAL  Result Date: 11/08/2020 CLINICAL DATA:  Acute renal injury. EXAM: RENAL / URINARY TRACT ULTRASOUND COMPLETE COMPARISON:  No prior. FINDINGS: Right Kidney: Renal measurements: 10.5 x 5.1 x 5.8 cm = volume: 164 mL. Increased echogenicity. No mass or hydronephrosis. Left Kidney: Renal measurements: 11.0 x 5.6 x 5.5 cm = volume: 175 mL. Increased echogenicity. No mass or hydronephrosis. Bladder: Bladder is very distended at 2407 cc. The patient has a PureWick in place. Other: None. IMPRESSION: 1. Increased echogenicity both kidneys consistent with chronic medical renal disease. No hydronephrosis. 2. The bladder is very distended at 2407 CC. Electronically Signed   By: Maisie Fus  Register   On: 11/08/2020 09:02   ECHOCARDIOGRAM COMPLETE  Result Date: 11/09/2020    ECHOCARDIOGRAM REPORT   Patient Name:   [facility] Date of Exam: 11/08/2020 Medical Rec #:  [id number]   Height:       65.0 in Accession #:    [id number]  Weight:       241.0 lb Date of Birth:  01/29/1961  BSA:          2.141 m Patient Age:    59 years    BP:           145/78 mmHg Patient Gender: F           HR:           85 bpm. Exam Location:  [facility] Procedure: 2D Echo, Cardiac Doppler and Color Doppler Indications:     I50.9 Congestive heart failure  History:         Patient has no prior history of Echocardiogram examinations.                  Risk Factors:Hypertension, Diabetes, Dyslipidemia, Current                  Smoker and Morbid Obesity. Stroke.  Sonographer:     Sedonia Small Rodgers-Jones Referring Phys:  [id number] Tresa Endo A GRIFFITH Diagnosing Phys: Alwyn Pea MD  IMPRESSIONS  1. Left ventricular ejection fraction, by estimation, is 65 to 70%. The left ventricle has normal function. The left ventricle has no regional wall motion abnormalities. Left ventricular diastolic parameters were normal.  2. Right ventricular systolic function is normal. The right ventricular size is normal.  3. The mitral valve is grossly normal. No evidence of mitral valve regurgitation.  4. The aortic valve is grossly normal. Aortic valve regurgitation is not visualized. FINDINGS  Left Ventricle: Left ventricular ejection fraction, by estimation, is 65 to 70%. The left ventricle has normal function. The left ventricle has no regional wall motion abnormalities. The left ventricular internal cavity size was normal in size. There is  no left ventricular hypertrophy. Left ventricular diastolic parameters  were normal. Right Ventricle: The right ventricular size is normal. No increase in right ventricular wall thickness. Right ventricular systolic function is normal. Left Atrium: Left atrial size was normal in size. Right Atrium: Right atrial size was normal in size. Pericardium: There is no evidence of pericardial effusion. Mitral Valve: The mitral valve is grossly normal. No evidence of mitral valve regurgitation. Tricuspid Valve: The tricuspid valve is normal in structure. Tricuspid valve regurgitation is trivial. Aortic Valve: The aortic valve is grossly normal. Aortic valve regurgitation is not visualized. Pulmonic Valve: The pulmonic valve was grossly normal. Pulmonic valve regurgitation is not visualized. Aorta: The ascending aorta was not well visualized. IAS/Shunts: No atrial level shunt detected by color flow Doppler.  LEFT VENTRICLE PLAX 2D LVIDd:         5.04 cm  Diastology LVIDs:         3.09 cm  LV e' medial:    6.09 cm/s LV PW:         1.02 cm  LV E/e' medial:  20.2 LV IVS:        1.02 cm  LV e' lateral:   8.70 cm/s LVOT diam:     2.10 cm  LV E/e' lateral: 14.1 LV SV:         52 LV SV Index:    24 LVOT Area:     3.46 cm  RIGHT VENTRICLE RV Basal diam:  3.65 cm RV S prime:     12.10 cm/s TAPSE (M-mode): 2.6 cm LEFT ATRIUM             Index       RIGHT ATRIUM           Index LA diam:        3.90 cm 1.82 cm/m  RA Area:     14.70 cm LA Vol (A2C):   49.6 ml 23.16 ml/m RA Volume:   41.80 ml  19.52 ml/m LA Vol (A4C):   41.8 ml 19.52 ml/m LA Biplane Vol: 47.4 ml 22.14 ml/m  AORTIC VALVE LVOT Vmax:   91.70 cm/s LVOT Vmean:  65.800 cm/s LVOT VTI:    0.149 m  AORTA Ao Root diam: 2.90 cm MITRAL VALVE MV Area (PHT): 3.99 cm     SHUNTS MV Decel Time: 190 msec     Systemic VTI:  0.15 m MV E velocity: 123.00 cm/s  Systemic Diam: 2.10 cm MV A velocity: 74.40 cm/s MV E/A ratio:  1.65 Dwayne D Callwood MD Electronically signed by Alwyn Pea MD Signature Date/Time: 11/09/2020/4:10:53 PM    Final    US Abdomen Limited RUQ (LIVER/GB)  Result Date: 11/08/2020 CLINICAL DATA:  Transaminitis. EXAM: ULTRASOUND ABDOMEN LIMITED RIGHT UPPER QUADRANT COMPARISON:  No prior. FINDINGS: Gallbladder: Cholecystectomy. Common bile duct: Diameter: 3.0 mm. Liver: Liver has a heterogeneous echogenic parenchymal pattern. No definite focal hepatic abnormality identified however focal hepatic abnormality would be difficult to detect due to the heterogeneous echogenic parenchymal pattern. Hepatic contour is slightly irregular. Cirrhosis cannot be excluded. Portal vein is patent on color Doppler imaging with normal direction of blood flow towards the liver. Other: None. IMPRESSION: 1. Cholecystectomy. No biliary distention. 2. Heterogeneous increased hepatic echogenicity consistent with fatty infiltration or hepatocellular disease. Hepatic contour is slightly irregular. Cirrhosis cannot be excluded. Electronically Signed   By: Maisie Fus  Register   On: 11/08/2020 10:32     Labs:   Basic Metabolic Panel: Recent Labs  Lab 11/09/20 9323 11/09/20 5573 11/10/20 2202 11/10/20 5427 11/11/20 0623  11/11/20 0542 11/12/20 0507  11/12/20 0507 11/13/20 0521 11/13/20 0521 11/14/20 0437 11/15/20 0607  NA 137   < > 136   < > 138  --  138  --  138  --  134* 133*  K 3.8   < > 3.7   < > 3.7   < > 3.4*   < > 3.8   < > 4.1 3.5  CL 106   < > 103   < > 105  --  106  --  104  --  99 98  CO2 19*   < > 20*   < > 21*  --  22  --  24  --  22 22  GLUCOSE 57*   < > 58*   < > 210*  --  64*  --  167*  --  355* 335*  BUN 52*   < > 49*   < > 47*  --  43*  --  36*  --  37* 37*  CREATININE 4.91*   < > 5.05*   < > 4.81*  --  4.42*  --  4.03*  --  3.58* 3.34*  CALCIUM 8.8*   < > 9.1   < > 8.8*  --  8.9  --  8.9  --  8.9 8.8*  MG  --   --  2.4  --  2.2  --   --   --  2.4  --  2.3 2.2  PHOS 6.1*  --   --   --   --   --   --   --   --   --   --   --    < > = values in this interval not displayed.   GFR Estimated Creatinine Clearance: 22.3 mL/min (A) (by C-G formula based on SCr of 3.34 mg/dL (H)). Liver Function Tests: Recent Labs  Lab 11/08/20 1822 11/09/20 0545 11/11/20 0542  AST 126*  --  103*  ALT 64*  --  65*  ALKPHOS 260*  --  372*  BILITOT 1.9*  --  1.2  PROT 7.2  --  7.3  ALBUMIN 1.9* 1.9* 1.8*   No results for input(s): LIPASE, AMYLASE in the last 168 hours. No results for input(s): AMMONIA in the last 168 hours. Coagulation profile No results for input(s): INR, PROTIME in the last 168 hours.  CBC: Recent Labs  Lab 11/11/20 0542 11/12/20 0507 11/13/20 0521 11/14/20 0437 11/15/20 0607  WBC 10.7* 10.7* 11.7* 13.9* 12.8*  HGB 9.2* 9.6* 9.6* 9.5* 9.2*  HCT 28.5* 29.5* 29.6* 29.4* 28.9*  MCV 79.8* 79.7* 79.1* 80.8 81.2  PLT 272 301 307 311 314   Cardiac Enzymes: No results for input(s): CKTOTAL, CKMB, CKMBINDEX, TROPONINI in the last 168 hours. BNP: Invalid input(s): POCBNP CBG: Recent Labs  Lab 11/14/20 1233 11/14/20 1614 11/14/20 2109 11/15/20 0815 11/15/20 1157  GLUCAP 292* 230* 295* 275* 258*   D-Dimer No results for input(s): DDIMER in the last 72 hours. Hgb A1c No results for input(s):  HGBA1C in the last 72 hours. Lipid Profile No results for input(s): CHOL, HDL, LDLCALC, TRIG, CHOLHDL, LDLDIRECT in the last 72 hours. Thyroid function studies No results for input(s): TSH, T4TOTAL, T3FREE, THYROIDAB in the last 72 hours.  Invalid input(s): FREET3 Anemia work up No results for input(s): VITAMINB12, FOLATE, FERRITIN, TIBC, IRON, RETICCTPCT in the last 72 hours. Microbiology Recent Results (from the past 240 hour(s))  Resp Panel by RT-PCR (Flu A&B, Covid)  Nasopharyngeal Swab     Status: None   Collection Time: 11/05/20  9:36 PM   Specimen: Nasopharyngeal Swab; Nasopharyngeal(NP) swabs in vial transport medium  Result Value Ref Range Status   SARS Coronavirus 2 by RT PCR NEGATIVE NEGATIVE Final    Comment: (NOTE) SARS-CoV-2 target nucleic acids are NOT DETECTED.  The SARS-CoV-2 RNA is generally detectable in upper respiratory specimens during the acute phase of infection. The lowest concentration of SARS-CoV-2 viral copies this assay can detect is 138 copies/mL. A negative result does not preclude SARS-Cov-2 infection and should not be used as the sole basis for treatment or other patient management decisions. A negative result may occur with  improper specimen collection/handling, submission of specimen other than nasopharyngeal swab, presence of viral mutation(s) within the areas targeted by this assay, and inadequate number of viral copies(<138 copies/mL). A negative result must be combined with clinical observations, patient history, and epidemiological information. The expected result is Negative.  Fact Sheet for Patients:  BloggerCourse.com  Fact Sheet for Healthcare Providers:  SeriousBroker.it  This test is no t yet approved or cleared by the [place] FDA and  has been authorized for detection and/or diagnosis of SARS-CoV-2 by FDA under an Emergency Use Authorization (EUA). This EUA will remain  in  effect (meaning this test can be used) for the duration of the COVID-19 declaration under Section 564(b)(1) of the Act, 21 U.S.C.section 360bbb-3(b)(1), unless the authorization is terminated  or revoked sooner.       Influenza A by PCR NEGATIVE NEGATIVE Final   Influenza B by PCR NEGATIVE NEGATIVE Final    Comment: (NOTE) The Xpert Xpress SARS-CoV-2/FLU/RSV plus assay is intended as an aid in the diagnosis of influenza from Nasopharyngeal swab specimens and should not be used as a sole basis for treatment. Nasal washings and aspirates are unacceptable for Xpert Xpress SARS-CoV-2/FLU/RSV testing.  Fact Sheet for Patients: BloggerCourse.com  Fact Sheet for Healthcare Providers: SeriousBroker.it  This test is not yet approved or cleared by the [place] FDA and has been authorized for detection and/or diagnosis of SARS-CoV-2 by FDA under an Emergency Use Authorization (EUA). This EUA will remain in effect (meaning this test can be used) for the duration of the COVID-19 declaration under Section 564(b)(1) of the Act, 21 U.S.C. section 360bbb-3(b)(1), unless the authorization is terminated or revoked.  Performed at [facility], [address] [place], [place] [address]   Culture, blood (routine x 2) Call MD if unable to obtain prior to antibiotics being given     Status: None   Collection Time: 11/06/20 12:46 AM   Specimen: BLOOD  Result Value Ref Range Status   Specimen Description BLOOD LEFT ANTECUBITAL  Final   Special Requests   Final    BOTTLES DRAWN AEROBIC AND ANAEROBIC Blood Culture adequate volume   Culture   Final    NO GROWTH 5 DAYS Performed at [facility], [address] [place], [place] [address]    Report Status 11/11/2020 FINAL  Final  Culture, blood (routine x 2) Call MD if unable to obtain prior to antibiotics being given     Status: None   Collection Time: 11/06/20 12:46 AM    Specimen: BLOOD  Result Value Ref Range Status   Specimen Description BLOOD BLOOD LEFT FOREARM  Final   Special Requests   Final    BOTTLES DRAWN AEROBIC AND ANAEROBIC Blood Culture adequate volume   Culture   Final    NO GROWTH  5 DAYS Performed at [facility], [address], [place], [place] [address]    Report Status 11/11/2020 FINAL  Final  Urine Culture     Status: None   Collection Time: 11/08/20  9:11 AM   Specimen: Urine, Random  Result Value Ref Range Status   Specimen Description   Final    URINE, RANDOM Performed at [facility], [address], [place], [place] [address]    Special Requests   Final    NONE Performed at [facility], [address], [place], [place] [address]    Culture   Final    NO GROWTH Performed at [facility], [address], [place], [place] [address]    Report Status 11/09/2020 FINAL  Final  Aerobic/Anaerobic Culture (surgical/deep wound)     Status: None (Preliminary result)   Collection Time: 11/13/20 12:01 PM   Specimen: PATH Soft tissue  Result Value Ref Range Status   Specimen Description   Final    TISSUE Performed at [facility], [address], [place], [place] [address]    Special Requests   Final    RIGHT BUTTOCK Performed at [facility], [address] [place], [place] [address]    Gram Stain   Final    ABUNDANT WBC PRESENT, PREDOMINANTLY PMN RARE GRAM POSITIVE COCCI Performed at [facility], [address], [place], [place] [address]    Culture   Final    FEW ENTEROCOCCUS FAECALIS NO ANAEROBES ISOLATED; CULTURE IN PROGRESS FOR 5 DAYS    Report Status PENDING  Incomplete   Organism ID, Bacteria ENTEROCOCCUS FAECALIS  Final      Susceptibility   Enterococcus faecalis - MIC*    AMPICILLIN <=2 SENSITIVE Sensitive     VANCOMYCIN 2 SENSITIVE Sensitive     GENTAMICIN SYNERGY SENSITIVE Sensitive     * FEW ENTEROCOCCUS FAECALIS  Aerobic/Anaerobic Culture  (surgical/deep wound)     Status: None (Preliminary result)   Collection Time: 11/13/20 12:01 PM   Specimen: PATH Cytology Misc. fluid; Body Fluid  Result Value Ref Range Status   Specimen Description   Final    WOUND Performed at [facility], [address], [place], [place] [address]    Special Requests   Final    RIGHT BUTTOCKS Performed at [facility], [address] [place], [place] [address]    Gram Stain   Final    RARE WBC PRESENT, PREDOMINANTLY PMN NO ORGANISMS SEEN    Culture   Final    RARE ENTEROCOCCUS FAECALIS SUSCEPTIBILITIES TO FOLLOW Performed at [facility], [address] [place], [place] [address]    Report Status PENDING  Incomplete     Discharge Instructions:   Discharge Instructions    Call MD for:  persistant nausea and vomiting   Complete by: As directed    Call MD for:  severe uncontrolled pain   Complete by: As directed    Call MD for:  temperature >100.4   Complete by: As directed    Diet - low sodium heart healthy   Complete by: As directed    Discharge instructions   Complete by: As directed    Follow up with primary care provider in one week. Check blood work at that time. Follow up with General surgery in 2 weeks and with nephrology in 1-2 weeks. Your dose of medications have been changed. Do not take lasix until seen by your primary  care provider or nephrology. Your dose of losartan has been decreased. Continue antibiotics as has been prescribed.   Discharge wound care:   Complete by: As directed    Continue BID dressing changes with dry gauze, secure with tape   Increase activity slowly   Complete by: As directed      Allergies as of 11/15/2020      Reactions   Lisinopril    Headache, shaking   Shellfish Allergy Hives      Medication List    STOP taking these medications   furosemide 20 MG tablet Commonly known as: LASIX     TAKE these medications   allopurinol 300 MG tablet Commonly known  as: ZYLOPRIM Take 300 mg by mouth daily.   amitriptyline 10 MG tablet Commonly known as: ELAVIL Take 5 tablets (50 mg total) by mouth at bedtime. What changed: medication strength   amoxicillin-clavulanate 500-125 MG tablet Commonly known as: Augmentin Take 1 tablet (500 mg total) by mouth 2 (two) times daily.   aspirin EC 81 MG tablet Take 81 mg by mouth daily. Swallow whole.   bisacodyl 5 MG EC tablet Commonly known as: DULCOLAX Take 5 mg by mouth daily as needed for moderate constipation.   carvedilol 25 MG tablet Commonly known as: COREG Take 25 mg by mouth 2 (two) times daily.   cetirizine 10 MG tablet Commonly known as: ZYRTEC Take 10 mg by mouth daily.   cholecalciferol 25 MCG (1000 UNIT) tablet Commonly known as: VITAMIN D3 Take 1,000 Units by mouth daily.   cloNIDine 0.2 MG tablet Commonly known as: CATAPRES Take 0.2 mg by mouth 2 (two) times daily.   clopidogrel 75 MG tablet Commonly known as: PLAVIX Take 75 mg by mouth daily.   clotrimazole-betamethasone cream Commonly known as: LOTRISONE Apply 1 application topically 2 (two) times daily.   hydrALAZINE 25 MG tablet Commonly known as: APRESOLINE Take 1 tablet (25 mg total) by mouth every 8 (eight) hours.   Levemir FlexTouch 100 UNIT/ML FlexPen Generic drug: insulin detemir Inject 60 Units into the skin daily. What changed: how much to take   levothyroxine 25 MCG tablet Commonly known as: SYNTHROID Take 25 mcg by mouth daily.   losartan 25 MG tablet Commonly known as: COZAAR Take 1 tablet (25 mg total) by mouth daily. What changed:   medication strength  how much to take   nicotine 21 mg/24hr patch Commonly known as: NICODERM CQ - dosed in mg/24 hours Place 1 patch (21 mg total) onto the skin daily. Start taking on: November 16, 2020   NovoLOG FlexPen 100 UNIT/ML FlexPen Generic drug: insulin aspart Inject 20 Units into the skin 3 (three) times daily with meals.   ondansetron 4 MG  tablet Commonly known as: ZOFRAN Take 4-8 mg by mouth every 8 (eight) hours as needed for nausea or vomiting.   pantoprazole 40 MG tablet Commonly known as: PROTONIX Take 40 mg by mouth daily.   Potassium 99 MG Tabs Take 99 mg by mouth daily.   pregabalin 50 MG capsule Commonly known as: LYRICA Take 1 capsule (50 mg total) by mouth 3 (three) times daily. What changed:   medication strength  how much to take   rosuvastatin 20 MG tablet Commonly known as: CRESTOR Take 20 mg by mouth at bedtime.   Trulicity 0.75 MG/0.5ML Sopn Generic drug: Dulaglutide Inject 0.75 mg into the skin every Wednesday.   vitamin B-12 1000 MCG tablet Commonly known as: CYANOCOBALAMIN Take 1,000 mcg by mouth  daily.            Discharge Care Instructions  (From admission, onward)         Start     Ordered   11/15/20 0000  Discharge wound care:       Comments: Continue BID dressing changes with dry gauze, secure with tape   11/15/20 1320            Discharge Care Instructions  (From admission, onward)         Start     Ordered   11/15/20 0000  Discharge wound care:       Comments: Continue BID dressing changes with dry gauze, secure with tape   11/15/20 1320          Follow-up Information    [facility]   Why: As needed Contact information: [address] [place] [address] [phone]        Henrene Dodge, MD. Schedule an appointment as soon as possible for a visit in 2 week(s).   Specialty: General Surgery Why: follow up with buttocks abscess wound Contact information: [address] [place] [address] [phone]        Mosetta Pigeon, MD. Schedule an appointment as soon as possible for a visit in 1 week(s).   Specialty: Nephrology Why: for kidney function followup Contact information: [address] [place] [address] [phone]                Time coordinating discharge: 39  minutes  Signed:  Zevin Nevares  [facility] 11/15/2020, 1:21 PM

## 2020-11-15 NOTE — Progress Notes (Addendum)
[facility]  ROUNDING NOTE   Subjective:   Tina Gregory is 59 years old African-American female with history of diabetes mellitus, hypertension, diabetic neuropathy, CVA, hyperlipidemia who was admitted on 11/05/2020 with community-acquired pneumonia and acute kidney injury.  Patient sleeping in bed, arousable to call. No acute distress noted.Her renal function is progressively improving.  Objective:  Vital signs in last 24 hours:  Temp:  [98.3 F (36.8 C)-98.9 F (37.2 C)] 98.7 F (37.1 C) (11/30 1158) Pulse Rate:  [59-71] 63 (11/30 1158) Resp:  [16-18] 18 (11/30 1158) BP: (155-185)/(82-95) 185/82 (11/30 1158) SpO2:  [93 %-100 %] 97 % (11/30 1158)  Weight change:  Filed Weights   11/05/20 1423  Weight: 109.3 kg    Intake/Output: I/O last 3 completed shifts: In: 675.8 [I.V.:475.8; IV Piggyback:199.9] Out: 2500 [Urine:2500]   Intake/Output this shift:  Total I/O In: 240 [P.O.:240] Out: -   Physical Exam: General:  Resting in bed, appears comfortable  Head:  Normocephalic, atraumatic  Eyes:  Sclerae and conjunctivae clear  Lungs:   Respirations symmetrical, lungs clear  Heart:  S1-S2, no rubs or gallops  Abdomen:  Soft, nontender, obese  Extremities: No peripheral edema.  Neurologic:  Sleeping, arousable to call  Skin:  rashes on her lower legs    Basic Metabolic Panel: Recent Labs  Lab 11/09/20 0545 11/09/20 0545 11/10/20 0537 11/10/20 0537 11/11/20 0542 11/11/20 0542 11/12/20 0507 11/12/20 0507 11/13/20 0521 11/14/20 0437 11/15/20 0607  NA 137   < > 136   < > 138  --  138  --  138 134* 133*  K 3.8   < > 3.7   < > 3.7  --  3.4*  --  3.8 4.1 3.5  CL 106   < > 103   < > 105  --  106  --  104 99 98  CO2 19*   < > 20*   < > 21*  --  22  --  24 22 22   GLUCOSE 57*   < > 58*   < > 210*  --  64*  --  167* 355* 335*  BUN 52*   < > 49*   < > 47*  --  43*  --  36* 37* 37*  CREATININE 4.91*   < > 5.05*   < > 4.81*  --  4.42*  --  4.03* 3.58* 3.34*   CALCIUM 8.8*   < > 9.1   < > 8.8*   < > 8.9   < > 8.9 8.9 8.8*  MG  --   --  2.4  --  2.2  --   --   --  2.4 2.3 2.2  PHOS 6.1*  --   --   --   --   --   --   --   --   --   --    < > = values in this interval not displayed.    Liver Function Tests: Recent Labs  Lab 11/08/20 1822 11/09/20 0545 11/11/20 0542  AST 126*  --  103*  ALT 64*  --  65*  ALKPHOS 260*  --  372*  BILITOT 1.9*  --  1.2  PROT 7.2  --  7.3  ALBUMIN 1.9* 1.9* 1.8*   No results for input(s): LIPASE, AMYLASE in the last 168 hours. No results for input(s): AMMONIA in the last 168 hours.  CBC: Recent Labs  Lab 11/11/20 0542 11/12/20 0507 11/13/20 0521 11/14/20 11/16/20  11/15/20 0607  WBC 10.7* 10.7* 11.7* 13.9* 12.8*  HGB 9.2* 9.6* 9.6* 9.5* 9.2*  HCT 28.5* 29.5* 29.6* 29.4* 28.9*  MCV 79.8* 79.7* 79.1* 80.8 81.2  PLT 272 301 307 311 314    Cardiac Enzymes: No results for input(s): CKTOTAL, CKMB, CKMBINDEX, TROPONINI in the last 168 hours.  BNP: Invalid input(s): POCBNP  CBG: Recent Labs  Lab 11/14/20 1233 11/14/20 1614 11/14/20 2109 11/15/20 0815 11/15/20 1157  GLUCAP 292* 230* 295* 275* 258*    Microbiology: Results for orders placed or performed during the hospital encounter of 11/05/20  Resp Panel by RT-PCR (Flu A&B, Covid) Nasopharyngeal Swab     Status: None   Collection Time: 11/05/20  9:36 PM   Specimen: Nasopharyngeal Swab; Nasopharyngeal(NP) swabs in vial transport medium  Result Value Ref Range Status   SARS Coronavirus 2 by RT PCR NEGATIVE NEGATIVE Final    Comment: (NOTE) SARS-CoV-2 target nucleic acids are NOT DETECTED.  The SARS-CoV-2 RNA is generally detectable in upper respiratory specimens during the acute phase of infection. The lowest concentration of SARS-CoV-2 viral copies this assay can detect is 138 copies/mL. A negative result does not preclude SARS-Cov-2 infection and should not be used as the sole basis for treatment or other patient management decisions. A  negative result may occur with  improper specimen collection/handling, submission of specimen other than nasopharyngeal swab, presence of viral mutation(s) within the areas targeted by this assay, and inadequate number of viral copies(<138 copies/mL). A negative result must be combined with clinical observations, patient history, and epidemiological information. The expected result is Negative.  Fact Sheet for Patients:  BloggerCourse.com  Fact Sheet for Healthcare Providers:  SeriousBroker.it  This test is no t yet approved or cleared by the [place] FDA and  has been authorized for detection and/or diagnosis of SARS-CoV-2 by FDA under an Emergency Use Authorization (EUA). This EUA will remain  in effect (meaning this test can be used) for the duration of the COVID-19 declaration under Section 564(b)(1) of the Act, 21 U.S.C.section 360bbb-3(b)(1), unless the authorization is terminated  or revoked sooner.       Influenza A by PCR NEGATIVE NEGATIVE Final   Influenza B by PCR NEGATIVE NEGATIVE Final    Comment: (NOTE) The Xpert Xpress SARS-CoV-2/FLU/RSV plus assay is intended as an aid in the diagnosis of influenza from Nasopharyngeal swab specimens and should not be used as a sole basis for treatment. Nasal washings and aspirates are unacceptable for Xpert Xpress SARS-CoV-2/FLU/RSV testing.  Fact Sheet for Patients: BloggerCourse.com  Fact Sheet for Healthcare Providers: SeriousBroker.it  This test is not yet approved or cleared by the [place] FDA and has been authorized for detection and/or diagnosis of SARS-CoV-2 by FDA under an Emergency Use Authorization (EUA). This EUA will remain in effect (meaning this test can be used) for the duration of the COVID-19 declaration under Section 564(b)(1) of the Act, 21 U.S.C. section 360bbb-3(b)(1), unless the authorization  is terminated or revoked.  Performed at [facility], [address] [place], [place] [address]   Culture, blood (routine x 2) Call MD if unable to obtain prior to antibiotics being given     Status: None   Collection Time: 11/06/20 12:46 AM   Specimen: BLOOD  Result Value Ref Range Status   Specimen Description BLOOD LEFT ANTECUBITAL  Final   Special Requests   Final    BOTTLES DRAWN AEROBIC AND ANAEROBIC Blood Culture adequate volume   Culture   Final  NO GROWTH 5 DAYS Performed at [facility], [address] [place], [place] [address]    Report Status 11/11/2020 FINAL  Final  Culture, blood (routine x 2) Call MD if unable to obtain prior to antibiotics being given     Status: None   Collection Time: 11/06/20 12:46 AM   Specimen: BLOOD  Result Value Ref Range Status   Specimen Description BLOOD BLOOD LEFT FOREARM  Final   Special Requests   Final    BOTTLES DRAWN AEROBIC AND ANAEROBIC Blood Culture adequate volume   Culture   Final    NO GROWTH 5 DAYS Performed at [facility], [address] [place], [place] [address]    Report Status 11/11/2020 FINAL  Final  Urine Culture     Status: None   Collection Time: 11/08/20  9:11 AM   Specimen: Urine, Random  Result Value Ref Range Status   Specimen Description   Final    URINE, RANDOM Performed at [facility], [address] [place], [place] [address]    Special Requests   Final    NONE Performed at [facility], [address] [place], [place] [address]    Culture   Final    NO GROWTH Performed at [facility], [address] [place], [place] [address]    Report Status 11/09/2020 FINAL  Final  Aerobic/Anaerobic Culture (surgical/deep wound)     Status: None (Preliminary result)   Collection Time: 11/13/20 12:01 PM   Specimen: PATH Soft tissue  Result Value Ref Range Status   Specimen Description   Final    TISSUE Performed at [facility],  [address] [place], [place] [address]    Special Requests   Final    RIGHT BUTTOCK Performed at [facility], [address] [place], [place] [address]    Gram Stain   Final    ABUNDANT WBC PRESENT, PREDOMINANTLY PMN RARE GRAM POSITIVE COCCI Performed at [facility], [address] [place], [place] [address]    Culture   Final    FEW ENTEROCOCCUS FAECALIS NO ANAEROBES ISOLATED; CULTURE IN PROGRESS FOR 5 DAYS    Report Status PENDING  Incomplete   Organism ID, Bacteria ENTEROCOCCUS FAECALIS  Final      Susceptibility   Enterococcus faecalis - MIC*    AMPICILLIN <=2 SENSITIVE Sensitive     VANCOMYCIN 2 SENSITIVE Sensitive     GENTAMICIN SYNERGY SENSITIVE Sensitive     * FEW ENTEROCOCCUS FAECALIS  Aerobic/Anaerobic Culture (surgical/deep wound)     Status: None (Preliminary result)   Collection Time: 11/13/20 12:01 PM   Specimen: PATH Cytology Misc. fluid; Body Fluid  Result Value Ref Range Status   Specimen Description   Final    WOUND Performed at [facility], [address] [place], [place] [address]    Special Requests   Final    RIGHT BUTTOCKS Performed at [facility], [address] [place], [place] [address]    Gram Stain   Final    RARE WBC PRESENT, PREDOMINANTLY PMN NO ORGANISMS SEEN    Culture   Final    RARE ENTEROCOCCUS FAECALIS SUSCEPTIBILITIES TO FOLLOW Performed at [facility], [address] [place], [place] [address]    Report Status PENDING  Incomplete    Coagulation Studies: No results for input(s): LABPROT, INR in the last 72 hours.  Urinalysis: No results for input(s): COLORURINE, LABSPEC, PHURINE, GLUCOSEU, HGBUR,  BILIRUBINUR, KETONESUR, PROTEINUR, UROBILINOGEN, NITRITE, LEUKOCYTESUR in the last 72 hours.  Invalid input(s): APPERANCEUR    Imaging: No results found.   Medications:   . ampicillin-sulbactam (UNASYN) IV 3 g (11/15/20 0430)  . sodium bicarbonate in D5W 1000 mL  infusion 50 mL/hr at 11/15/20 1303   . amitriptyline  10 mg Oral QHS  . aspirin EC  81 mg Oral Daily  . carvedilol  25 mg Oral BID  . Chlorhexidine Gluconate Cloth  6 each Topical Daily  . cloNIDine  0.2 mg Oral BID  . clopidogrel  75 mg Oral Daily  . heparin injection (subcutaneous)  5,000 Units Subcutaneous Q8H  . hydrALAZINE  25 mg Oral Q8H  . hydrocerin   Topical BID  . hydrocortisone cream   Topical BID  . insulin aspart  0-15 Units Subcutaneous TID WC  . insulin aspart  0-5 Units Subcutaneous QHS  . insulin aspart  4 Units Subcutaneous TID WC  . insulin detemir  20 Units Subcutaneous BID  . levothyroxine  25 mcg Oral Daily  . loratadine  10 mg Oral Daily  . losartan  25 mg Oral Daily  . multivitamin with minerals  1 tablet Oral Daily  . nicotine  21 mg Transdermal Daily  . Ensure Max Protein  11 oz Oral Daily  . saccharomyces boulardii  250 mg Oral BID   acetaminophen, dextrose, guaiFENesin-dextromethorphan, HYDROmorphone (DILAUDID) injection, loperamide, oxyCODONE  Assessment/ Plan:  Tina Gregory is a 59 y.o.  female with history of diabetes mellitus, hypertension, diabetic neuropathy, CVA, hyperlipidemia who was admitted on 11/05/2020 with community-acquired pneumonia and acute kidney injury.  # Acute kidney injury with proteinuria Baseline creatinine unknown. Creatinine on admission of 2.96, GFR of 18.  Lab Results  Component Value Date   CREATININE 3.34 (H) 11/15/2020   CREATININE 3.58 (H) 11/14/2020   CREATININE 4.03 (H) 11/13/2020  Patient can be followed with her outpatient nephrology team if getting discharged today Renal  function getting improved gradually  #Diabetes type II with CKD Lab Results  Component Value Date   HGBA1C 10.8 (H) 11/06/2020  Not well controlled Diabetes coordinator recommending increase in insulin doses  #Hypertension Added losartan yesterday for elevated blood pressure readings Will continue monitoring      Willadean Guyton [facility] 11/30/20211:26 PM

## 2020-11-18 LAB — AEROBIC/ANAEROBIC CULTURE W GRAM STAIN (SURGICAL/DEEP WOUND)

## 2021-05-17 DEATH — deceased

## 2021-10-01 IMAGING — CR DG KNEE COMPLETE 4+V*L*
4 series · 4 of 4 positions shown · non-contrast
Comparison: None.

CLINICAL DATA: Left knee pain,

EXAM:
LEFT KNEE - COMPLETE 4+ VIEW

[knee ap]
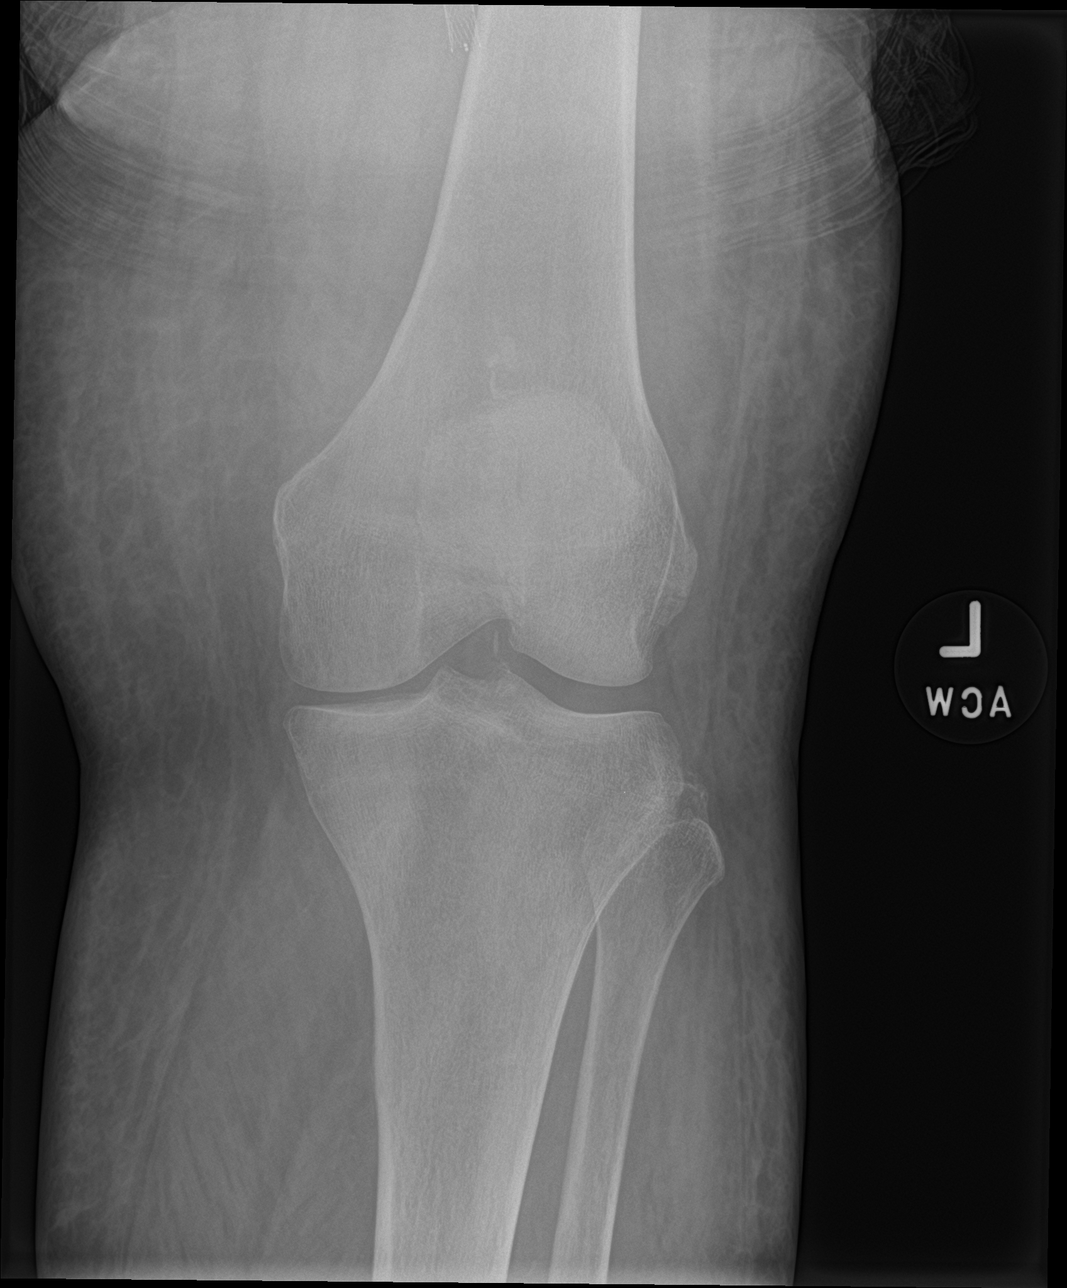

[knee obl (1 of 2)]
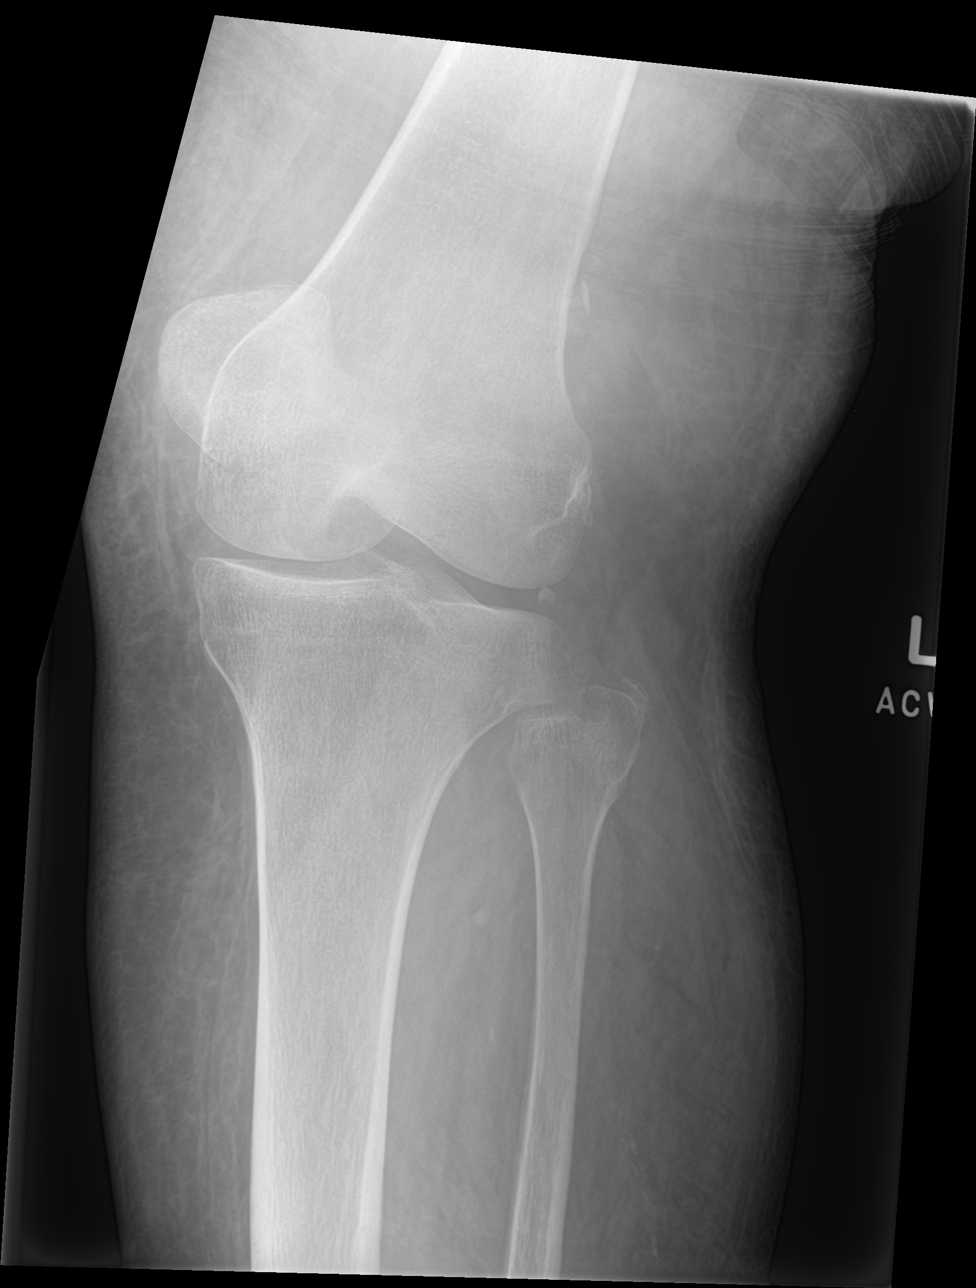

[knee obl (2 of 2)]
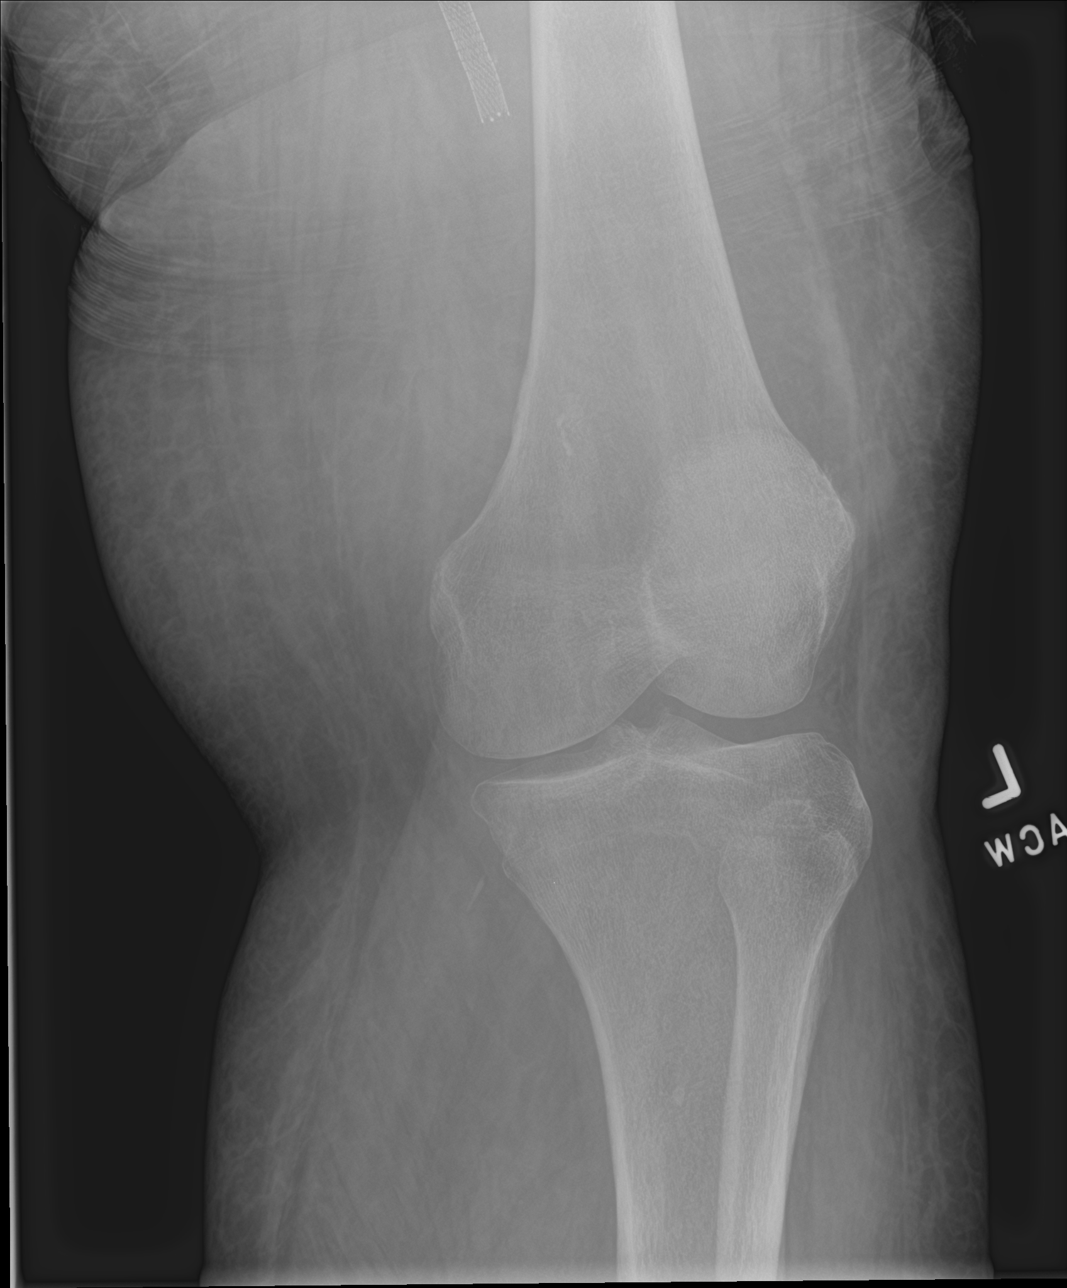

[knee lat]
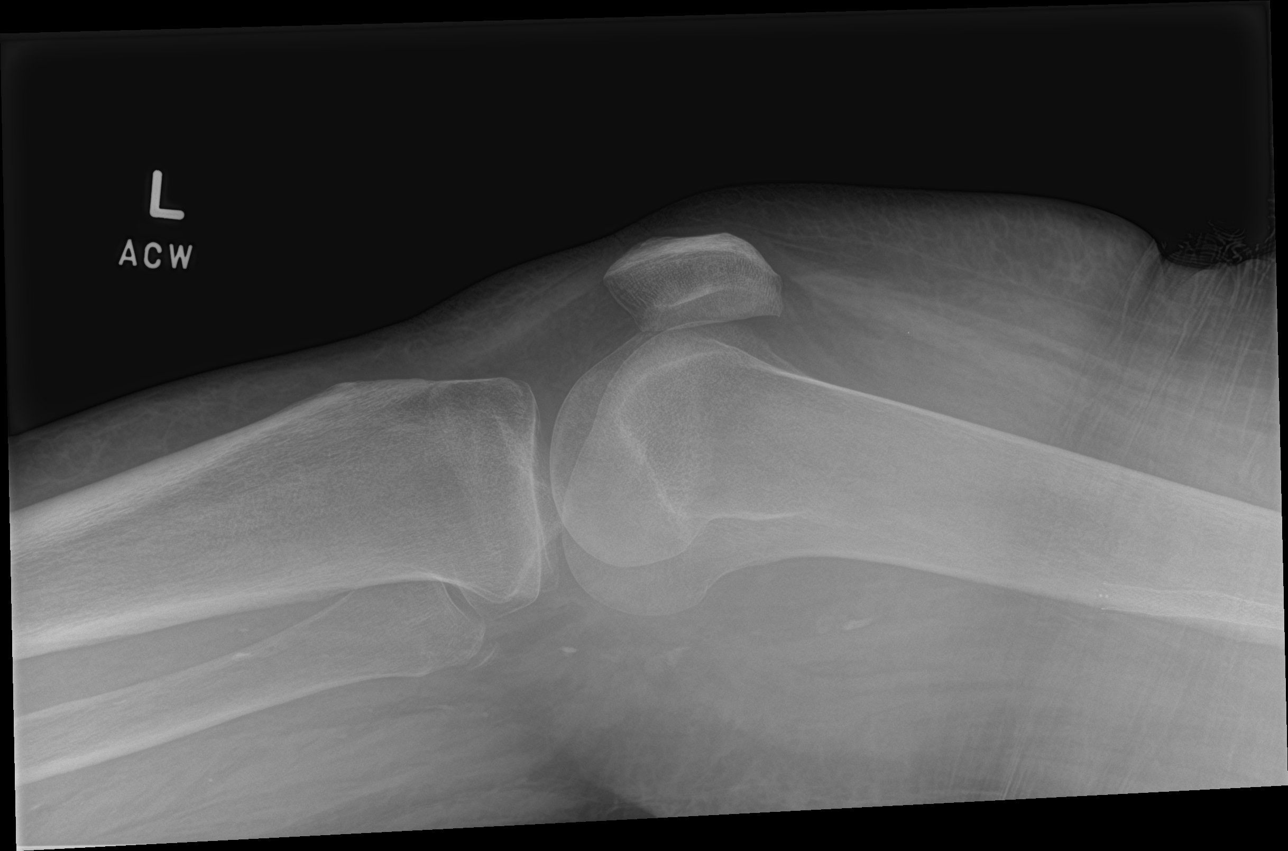

[4 of 4 positions shown; findings below may reference images not displayed]

FINDINGS: No evidence of fracture, dislocation, or joint effusion. No evidence
of arthropathy or other focal bone abnormality. There is diffuse
soft tissue swelling. Mild medial compartment osteoarthritis is
noted.
IMPRESSION: No acute osseous abnormality.

Diffuse soft tissue swelling.
